# Patient Record
Sex: Female | Born: 1965 | Race: Black or African American | Hispanic: No | Marital: Single | Smoking: Current every day smoker
Health system: Southern US, Community
[De-identification: ages and names within clinical notes are randomized; demographics above are authoritative.]

## PROBLEM LIST (undated history)

## (undated) DIAGNOSIS — B009 Herpesviral infection, unspecified: Secondary | ICD-10-CM

## (undated) DIAGNOSIS — D219 Benign neoplasm of connective and other soft tissue, unspecified: Secondary | ICD-10-CM

## (undated) DIAGNOSIS — D649 Anemia, unspecified: Secondary | ICD-10-CM

## (undated) HISTORY — DX: Herpesviral infection, unspecified: B00.9

## (undated) HISTORY — DX: Benign neoplasm of connective and other soft tissue, unspecified: D21.9

## (undated) HISTORY — PX: TUBAL LIGATION: SHX77

## (undated) HISTORY — PX: UTERINE ARTERY EMBOLIZATION: SHX2629

## (undated) HISTORY — DX: Anemia, unspecified: D64.9

---

## 2008-10-12 ENCOUNTER — Emergency Department (HOSPITAL_COMMUNITY): Admission: EM | Admit: 2008-10-12 | Discharge: 2008-10-12 | Payer: Self-pay | Admitting: Family Medicine

## 2009-02-23 ENCOUNTER — Encounter: Admission: RE | Admit: 2009-02-23 | Discharge: 2009-02-23 | Payer: Self-pay | Admitting: Family Medicine

## 2009-03-05 ENCOUNTER — Encounter: Admission: RE | Admit: 2009-03-05 | Discharge: 2009-03-05 | Payer: Self-pay | Admitting: Family Medicine

## 2010-08-16 ENCOUNTER — Ambulatory Visit
Admission: RE | Admit: 2010-08-16 | Discharge: 2010-08-16 | Payer: Self-pay | Source: Home / Self Care | Attending: Gynecology | Admitting: Gynecology

## 2010-09-01 ENCOUNTER — Encounter: Payer: Self-pay | Admitting: Family Medicine

## 2010-09-02 ENCOUNTER — Encounter: Payer: Self-pay | Admitting: Family Medicine

## 2011-09-15 ENCOUNTER — Ambulatory Visit (INDEPENDENT_AMBULATORY_CARE_PROVIDER_SITE_OTHER): Payer: Managed Care, Other (non HMO) | Admitting: Family Medicine

## 2011-09-15 ENCOUNTER — Ambulatory Visit: Payer: Managed Care, Other (non HMO) | Admitting: Radiology

## 2011-09-15 ENCOUNTER — Ambulatory Visit: Payer: Managed Care, Other (non HMO)

## 2011-09-15 DIAGNOSIS — N3943 Post-void dribbling: Secondary | ICD-10-CM

## 2011-09-15 DIAGNOSIS — R1084 Generalized abdominal pain: Secondary | ICD-10-CM

## 2011-09-15 DIAGNOSIS — Z113 Encounter for screening for infections with a predominantly sexual mode of transmission: Secondary | ICD-10-CM

## 2011-09-15 DIAGNOSIS — D649 Anemia, unspecified: Secondary | ICD-10-CM

## 2011-09-15 DIAGNOSIS — D619 Aplastic anemia, unspecified: Secondary | ICD-10-CM

## 2011-09-15 DIAGNOSIS — R39198 Other difficulties with micturition: Secondary | ICD-10-CM

## 2011-09-15 DIAGNOSIS — R102 Pelvic and perineal pain: Secondary | ICD-10-CM

## 2011-09-15 LAB — POCT URINALYSIS DIPSTICK
Bilirubin, UA: NEGATIVE
Glucose, UA: NEGATIVE
Ketones, UA: NEGATIVE
Leukocytes, UA: NEGATIVE
Nitrite, UA: NEGATIVE
Protein, UA: NEGATIVE
Spec Grav, UA: 1.02
Urobilinogen, UA: 0.2
pH, UA: 5.5

## 2011-09-15 LAB — POCT CBC
Granulocyte percent: 89 %G — AB (ref 37–80)
HCT, POC: 36.1 % — AB (ref 37.7–47.9)
Hemoglobin: 11.5 g/dL — AB (ref 12.2–16.2)
Lymph, poc: 1 (ref 0.6–3.4)
MCH, POC: 29.8 pg (ref 27–31.2)
MCHC: 31.9 g/dL (ref 31.8–35.4)
MCV: 93.6 fL (ref 80–97)
MID (cbc): 0.5 (ref 0–0.9)
MPV: 8 fL (ref 0–99.8)
POC Granulocyte: 11.9 — AB (ref 2–6.9)
POC LYMPH PERCENT: 7.3 %L — AB (ref 10–50)
POC MID %: 3.7 % (ref 0–12)
Platelet Count, POC: 325 10*3/uL (ref 142–424)
RBC: 3.86 M/uL — AB (ref 4.04–5.48)
RDW, POC: 13.8 %
WBC: 13.4 10*3/uL — AB (ref 4.6–10.2)

## 2011-09-15 LAB — POCT WET PREP WITH KOH
Clue Cells Wet Prep HPF POC: NEGATIVE
KOH Prep POC: NEGATIVE
Trichomonas, UA: NEGATIVE
Yeast Wet Prep HPF POC: NEGATIVE

## 2011-09-15 LAB — POCT UA - MICROSCOPIC ONLY
Casts, Ur, LPF, POC: NEGATIVE
Crystals, Ur, HPF, POC: NEGATIVE
Mucus, UA: NEGATIVE
Yeast, UA: NEGATIVE

## 2011-09-15 MED ORDER — CEFTRIAXONE SODIUM 1 G IJ SOLR: 1.0000 g | Freq: Once | INTRAMUSCULAR | Status: DC

## 2011-09-15 MED ORDER — CIPROFLOXACIN HCL 500 MG PO TABS: 500.0000 mg | ORAL_TABLET | Freq: Two times a day (BID) | ORAL | Status: AC

## 2011-09-15 MED ORDER — CEFTRIAXONE SODIUM 1 G IJ SOLR
1.0000 g | INTRAMUSCULAR | Status: AC
  Administered 2011-09-15: 1 g via INTRAMUSCULAR

## 2011-09-15 NOTE — Progress Notes (Signed)
[facility]:  Office Visit  Chief Complaint:  Chief Complaint  Patient presents with  . Abdominal Pain    x 1 day radiating to back   . Nausea    x 1 day    HPI: Kendra Alvarado is a 46 y.o. female who complains of 1 day history of low abd pain and pelvic pain bilaterally radiating to back. She is currently on the end of her period. Has a h/o of fibroids s/p uterine artery embolization. She has had chills, and nausea. Temp has not been greater than 98.0. However states she has had subjective fevers. Able to pass gas, able to eat, had 2 normal BMs yesterday,  this morning. Still has gallbladder and appendix. No abd surgeries. No recent travels, no new foods, no diarrhea/constipation. Does not feel like her normal menstrual cramps. + h/o anemia has been off of iron supplements. Sexually active without using condoms. "pain is close to child birth".  Past Medical History  Diagnosis Date  . Anemia   . Fibroids     uterine  . HSV-2 infection   . HSV-2 (herpes simplex virus 2) infection    Past Surgical History  Procedure Date  . Uterine artery embolization   . Tubal ligation    History   Social History  . Marital Status: Single    Spouse Name: N/A    Number of Children: N/A  . Years of Education: N/A   Social History Main Topics  . Smoking status: Current Everyday Smoker -- 0.5 packs/day    Types: Cigarettes  . Smokeless tobacco: None  . Alcohol Use: No  . Drug Use: Yes    Special: Marijuana  . Sexually Active: Yes    Birth Control/ Protection: Coitus interruptus   Other Topics Concern  . None   Social History Narrative  . None   Family History  Problem Relation Age of Onset  . Asthma Mother   . Hypertension Mother   . Heart attack Father    No Known Allergies Prior to Admission medications   Medication Sig Start Date End Date Taking? Authorizing Provider  aspirin 325 MG EC tablet Take 325 mg by mouth daily.   Yes Historical Provider, MD      ROS: The patient denies night sweats, unintentional weight loss, chest pain, palpitations, wheezing, dyspnea on exertion, vomiting, abdominal pain, dysuria, hematuria, melena, numbness, weakness, or tingling. + decrease urinary stream, abd pain, flank pain, sub fevers and chills. + nausea  All other systems have been reviewed and were otherwise negative with the exception of those mentioned in the HPI and as above.    PHYSICAL EXAM: Filed Vitals:   09/15/11 1124  BP: 104/67  Pulse: 83  Temp: 98.4 F (36.9 C)  Resp: 16   Filed Vitals:   09/15/11 1124  Height: 5' 5.75" (1.67 m)  Weight: 150 lb (68.04 kg)   Body mass index is 24.40 kg/(m^2).  General: Alert, illl appearing HEENT:  Normocephalic, atraumatic, oropharynx patent. Cardiovascular:  Regular rate and rhythm, no rubs murmurs or gallops.  No Carotid bruits, radial pulse intact. No pedal edema.  Respiratory: Clear to auscultation bilaterally.  No wheezes, rales, or rhonchi.  No cyanosis, no use of accessory musculature GI: No organomegaly, abdomen is soft,  Decrease bowel sounds.  + tenderness lower abd. And pelvis, + guarding.  No masses. Skin: No rashes. Neurologic: Facial musculature symmetric. Psychiatric: Patient is appropriate throughout our interaction. Lymphatic: No axillary or cervical lymphadenopathy  Musculoskeletal: Gait intact. Genitourinary: Tenderness pelvis, no CMT, no lesions, no purulent dc, + menses  LABS: Results for orders placed in visit on 09/15/11  POCT UA - MICROSCOPIC ONLY      Component Value Range   WBC, Ur, HPF, POC 2-4     RBC, urine, microscopic 3-6     Bacteria, U Microscopic small     Mucus, UA negative     Epithelial cells, urine per micros 2-5     Crystals, Ur, HPF, POC negative     Casts, Ur, LPF, POC negative     Yeast, UA negative    POCT URINALYSIS DIPSTICK      Component Value Range   Color, UA yellow     Clarity, UA hazy     Glucose, UA negative     Bilirubin, UA  negative     Ketones, UA negative     Spec Grav, UA 1.020     Blood, UA moderate     pH, UA 5.5     Protein, UA negative     Urobilinogen, UA 0.2     Nitrite, UA negative     Leukocytes, UA Negative    POCT CBC      Component Value Range   WBC 13.4 (*) 4.6 - 10.2 (K/uL)   Lymph, poc 1.0  0.6 - 3.4    POC LYMPH PERCENT 7.3 (*) 10 - 50 (%L)   MID (cbc) 0.5  0 - 0.9    POC MID % 3.7  0 - 12 (%M)   POC Granulocyte 11.9 (*) 2 - 6.9    Granulocyte percent 89.0 (*) 37 - 80 (%G)   RBC 3.86 (*) 4.04 - 5.48 (M/uL)   Hemoglobin 11.5 (*) 12.2 - 16.2 (g/dL)   HCT, POC 16.1 (*) 09.6 - 47.9 (%)   MCV 93.6  80 - 97 (fL)   MCH, POC 29.8  27 - 31.2 (pg)   MCHC 31.9  31.8 - 35.4 (g/dL)   RDW, POC 04.5     Platelet Count, POC 325  142 - 424 (K/uL)   MPV 8.0  0 - 99.8 (fL)  POCT WET PREP WITH KOH      Component Value Range   Trichomonas, UA Negative     Clue Cells Wet Prep HPF POC negative     Epithelial Wet Prep HPF POC 1-4     Yeast Wet Prep HPF POC negative     Bacteria Wet Prep HPF POC trace     RBC Wet Prep HPF POC 1-4     WBC Wet Prep HPF POC 0-1     KOH Prep POC Negative       EKG/XRAY:   Primary read interpreted by Dr. Conley Rolls at [facility]: abd xray + feces, no obstruction.   ASSESSMENT/PLAN: Encounter Diagnoses  Name Primary?  . Abdominal pain   . Urinary dribbling   . Anemia   . Screening for STD (sexually transmitted disease)   . Pelvic pain Yes    1. Pevic and lower abd pain-? Appendicitis vs diverticular dz vs ovarian cyst less likely kidney stones or obstruction vs STD. Pt has a white count of 13.4. Will get stat CT scan and give Cipro 500 mg BID x 10 days. Labs pending HIV, RPR, G/C gential probe.    Rockne Coons, DO 09/15/2011 3:06 PM   Patient's phone number today is [phone]

## 2011-09-16 ENCOUNTER — Telehealth: Payer: Self-pay | Admitting: Family Medicine

## 2011-09-16 ENCOUNTER — Ambulatory Visit
Admission: RE | Admit: 2011-09-16 | Discharge: 2011-09-16 | Disposition: A | Discharge status: Home / Self Care | Payer: Self-pay | Source: Ambulatory Visit | Attending: Family Medicine | Admitting: Family Medicine

## 2011-09-16 DIAGNOSIS — N3943 Post-void dribbling: Secondary | ICD-10-CM

## 2011-09-16 DIAGNOSIS — Z113 Encounter for screening for infections with a predominantly sexual mode of transmission: Secondary | ICD-10-CM

## 2011-09-16 DIAGNOSIS — D649 Anemia, unspecified: Secondary | ICD-10-CM

## 2011-09-16 LAB — GC/CHLAMYDIA PROBE AMP, GENITAL
Chlamydia, DNA Probe: NEGATIVE
GC Probe Amp, Genital: NEGATIVE

## 2011-09-16 LAB — RPR

## 2011-09-16 LAB — HIV ANTIBODY (ROUTINE TESTING W REFLEX): HIV: NONREACTIVE

## 2011-09-16 MED ORDER — IOHEXOL 300 MG/ML  SOLN
100.0000 mL | Freq: Once | INTRAMUSCULAR | Status: AC | PRN
  Administered 2011-09-16: 100 mL via INTRAVENOUS

## 2011-09-16 MED ORDER — IOHEXOL 300 MG/ML  SOLN
40.0000 mL | Freq: Once | INTRAMUSCULAR | Status: AC | PRN
  Administered 2011-09-16: 40 mL via ORAL

## 2011-09-16 NOTE — Telephone Encounter (Signed)
Spoke with patient today, she is feeling better. Discussed with her CT results and would like her to finish her Cipro and f/u next week so that we can assess if she needs f/u with GI doctor and any additional imaging ie MRI of abdomen with focus on pancreas based on abnormal CT results. She states she will come on Monday.

## 2011-09-30 ENCOUNTER — Encounter: Payer: Self-pay | Admitting: Family Medicine

## 2011-10-23 ENCOUNTER — Encounter: Payer: Self-pay | Admitting: Family Medicine

## 2012-03-15 NOTE — Progress Notes (Signed)
This encounter was created in error - please disregard.

## 2012-07-26 ENCOUNTER — Emergency Department (HOSPITAL_COMMUNITY): Payer: PRIVATE HEALTH INSURANCE

## 2012-07-26 ENCOUNTER — Emergency Department (HOSPITAL_COMMUNITY)
Admission: EM | Admit: 2012-07-26 | Discharge: 2012-07-26 | Disposition: A | Discharge status: Home / Self Care | Payer: PRIVATE HEALTH INSURANCE | Attending: Emergency Medicine | Admitting: Emergency Medicine

## 2012-07-26 ENCOUNTER — Encounter (HOSPITAL_COMMUNITY): Payer: Self-pay | Admitting: Emergency Medicine

## 2012-07-26 DIAGNOSIS — D259 Leiomyoma of uterus, unspecified: Secondary | ICD-10-CM

## 2012-07-26 DIAGNOSIS — Z791 Long term (current) use of non-steroidal anti-inflammatories (NSAID): Secondary | ICD-10-CM | POA: Insufficient documentation

## 2012-07-26 DIAGNOSIS — Z9851 Tubal ligation status: Secondary | ICD-10-CM | POA: Insufficient documentation

## 2012-07-26 DIAGNOSIS — F17200 Nicotine dependence, unspecified, uncomplicated: Secondary | ICD-10-CM | POA: Insufficient documentation

## 2012-07-26 DIAGNOSIS — R109 Unspecified abdominal pain: Secondary | ICD-10-CM | POA: Insufficient documentation

## 2012-07-26 LAB — COMPREHENSIVE METABOLIC PANEL
BUN: 7 mg/dL (ref 6–23)
CO2: 26 mEq/L (ref 19–32)
Calcium: 8.9 mg/dL (ref 8.4–10.5)
Creatinine, Ser: 0.65 mg/dL (ref 0.50–1.10)
GFR calc Af Amer: >90 mL/min (ref >90)
GFR calc non Af Amer: >90 mL/min (ref >90)
Glucose, Bld: 105 mg/dL — ABNORMAL HIGH (ref 70–99)
Total Protein: 6.7 g/dL (ref 6.0–8.3)

## 2012-07-26 LAB — URINALYSIS, ROUTINE W REFLEX MICROSCOPIC
Bilirubin Urine: NEGATIVE
Leukocytes, UA: NEGATIVE
Nitrite: NEGATIVE
Specific Gravity, Urine: 1.033 — ABNORMAL HIGH (ref 1.005–1.030)
Urobilinogen, UA: 1 mg/dL (ref 0.0–1.0)

## 2012-07-26 LAB — CBC WITH DIFFERENTIAL/PLATELET
Eosinophils Absolute: 0.1 10*3/uL (ref 0.0–0.7)
Eosinophils Relative: 1 % (ref 0–5)
HCT: 36.4 % (ref 36.0–46.0)
Lymphocytes Relative: 35 % (ref 12–46)
Lymphs Abs: 2.4 10*3/uL (ref 0.7–4.0)
MCH: 31.9 pg (ref 26.0–34.0)
MCV: 94.3 fL (ref 78.0–100.0)
Monocytes Absolute: 0.5 10*3/uL (ref 0.1–1.0)
RBC: 3.86 MIL/uL — ABNORMAL LOW (ref 3.87–5.11)
RDW: 12.6 % (ref 11.5–15.5)
WBC: 7.1 10*3/uL (ref 4.0–10.5)

## 2012-07-26 LAB — URINE MICROSCOPIC-ADD ON

## 2012-07-26 MED ORDER — KETOROLAC TROMETHAMINE 30 MG/ML IJ SOLN
30.0000 mg | Freq: Once | INTRAMUSCULAR | Status: AC
  Administered 2012-07-26: 30 mg via INTRAVENOUS
  Filled 2012-07-26: qty 1

## 2012-07-26 MED ORDER — HYDROMORPHONE HCL PF 1 MG/ML IJ SOLN
1.0000 mg | Freq: Once | INTRAMUSCULAR | Status: AC
  Administered 2012-07-26: 1 mg via INTRAVENOUS
  Filled 2012-07-26: qty 1

## 2012-07-26 MED ORDER — METOCLOPRAMIDE HCL 10 MG PO TABS: 10.0000 mg | ORAL_TABLET | Freq: Three times a day (TID) | ORAL | Status: DC | PRN

## 2012-07-26 MED ORDER — ONDANSETRON HCL 4 MG/2ML IJ SOLN
INTRAMUSCULAR | Status: AC
  Filled 2012-07-26: qty 2

## 2012-07-26 NOTE — ED Provider Notes (Signed)
History     CSN: [id number]  Arrival date & time 07/26/12  4098   First MD Initiated Contact with Patient 07/26/12 0325      Chief Complaint  Patient presents with  . Flank Pain    (Consider location/radiation/quality/duration/timing/severity/associated sxs/prior treatment) HPI Comments: 46 year old female with a history of no significant past medical history who presents with 2 days of intermittent left-sided sharp and stabbing pain. Nothing seems to make it better or worse, there is no comfortable position, she does not have any nausea or diaphoresis and denies any hematuria. She has taken Aleve with mild improvement. At this time her pain is moderate.  Patient is a 46 y.o. female presenting with flank pain. The history is provided by the patient.  Flank Pain    Past Medical History  Diagnosis Date  . Anemia   . Fibroids     uterine  . HSV-2 infection   . HSV-2 (herpes simplex virus 2) infection     Past Surgical History  Procedure Date  . Uterine artery embolization   . Tubal ligation     Family History  Problem Relation Age of Onset  . Asthma Mother   . Hypertension Mother   . Heart attack Father     History  Substance Use Topics  . Smoking status: Current Every Day Smoker -- 0.5 packs/day    Types: Cigarettes  . Smokeless tobacco: Not on file  . Alcohol Use: No    OB History    Grav Para Term Preterm Abortions TAB SAB Ect Mult Living                  Review of Systems  Genitourinary: Positive for flank pain.  All other systems reviewed and are negative.    Allergies  Review of patient's allergies indicates no known allergies.  Home Medications   Current Outpatient Rx  Name  Route  Sig  Dispense  Refill  . BC HEADACHE POWDER PO   Oral   Take 1 packet by mouth daily as needed. For pain         . FERROUS SULFATE CR PO   Oral   Take by mouth.         . ADULT MULTIVITAMIN W/MINERALS CH   Oral   Take 1 tablet by mouth daily.          Marland Kitchen NAPROXEN SODIUM 220 MG PO TABS   Oral   Take 440 mg by mouth 2 (two) times daily with a meal.         . OVER THE COUNTER MEDICATION   Oral   Take 1 tablet by mouth daily. Stool softener         . METOCLOPRAMIDE HCL 10 MG PO TABS   Oral   Take 1 tablet (10 mg total) by mouth 3 (three) times daily as needed (headache / nausea).   20 tablet   0     BP 122/83  Pulse 58  Temp 97.7 F (36.5 C) (Oral)  Resp 18  SpO2 98%  LMP 07/19/2012  Physical Exam  Nursing note and vitals reviewed. Constitutional: She appears well-developed and well-nourished. No distress.  HENT:  Head: Normocephalic and atraumatic.  Mouth/Throat: Oropharynx is clear and moist. No oropharyngeal exudate.  Eyes: Conjunctivae normal and EOM are normal. Pupils are equal, round, and reactive to light. Right eye exhibits no discharge. Left eye exhibits no discharge. No scleral icterus.  Neck: Normal range of motion. Neck supple. No JVD  present. No thyromegaly present.  Cardiovascular: Normal rate, regular rhythm, normal heart sounds and intact distal pulses.  Exam reveals no gallop and no friction rub.   No murmur heard. Pulmonary/Chest: Effort normal and breath sounds normal. No respiratory distress. She has no wheezes. She has no rales.  Abdominal: Soft. Bowel sounds are normal. She exhibits no distension and no mass. There is no tenderness.       The abdomen is soft and nontender, no CVA tenderness  Musculoskeletal: Normal range of motion. She exhibits no edema and no tenderness.  Lymphadenopathy:    She has no cervical adenopathy.  Neurological: She is alert. Coordination normal.  Skin: Skin is warm and dry. No rash noted. No erythema.  Psychiatric: She has a normal mood and affect. Her behavior is normal.    ED Course  Procedures (including critical care time)  Labs Reviewed  CBC WITH DIFFERENTIAL - Abnormal; Notable for the following:    RBC 3.86 (*)     All other components within normal  limits  COMPREHENSIVE METABOLIC PANEL - Abnormal; Notable for the following:    Potassium 3.4 (*)     Glucose, Bld 105 (*)     Total Bilirubin 0.2 (*)     All other components within normal limits  URINALYSIS, ROUTINE W REFLEX MICROSCOPIC - Abnormal; Notable for the following:    Specific Gravity, Urine 1.033 (*)     Hgb urine dipstick SMALL (*)     All other components within normal limits  URINE MICROSCOPIC-ADD ON - Abnormal; Notable for the following:    Squamous Epithelial / LPF FEW (*)     Bacteria, UA FEW (*)     All other components within normal limits   Ct Abdomen Pelvis Wo Contrast  07/26/2012  *RADIOLOGY REPORT*  Clinical Data: Left flank pain  CT ABDOMEN AND PELVIS WITHOUT CONTRAST  Technique:  Multidetector CT imaging of the abdomen and pelvis was performed following the standard protocol without intravenous contrast.  Comparison: 09/16/2011  Findings: Lung bases are predominately clear.  Heart size within normal limits.  No pleural or pericardial effusion.  Organ abnormality/lesion detection is limited in the absence of intravenous contrast. Within this limitation, unremarkable liver, biliary system, spleen, adrenal glands.  Unchanged cystic focus along the anterior body/neck of the pancreas.  Symmetric renal size.  No hydronephrosis or hydroureter.  No bowel obstruction.  There is mild stranding of the right mesorectal fat.  Normal appendix.  Nonspecific small bowel air- fluid levels without overt dilatation. There is fecalization of a left lower quadrant loop suggesting delayed transit.  No free intraperitoneal air or fluid.  No lymphadenopathy.  There is scattered atherosclerotic calcification of the aorta and its branches. No aneurysmal dilatation.  Small fat containing umbilical hernia.  Fibroid uterus.  Decompressed bladder.  Adnexa inseparable from the enlarged uterus.  No acute osseous finding.  IMPRESSION: Nonspecific appearance to small bowel.  There are a few air-fluid  levels and evidence of delayed transit, without evidence of obstruction or active inflammation at this time.  Evaluation is somewhat limited in the absence of intravenous or enteric contrast.  Mild right mesorectal fat stranding may reflect acute or sequelae of prior rectal or uterine inflammation.  Fibroid uterus.  Unchanged cystic focus along the anterior body/neck of the pancreas measuring 8 mm.   Original Report Authenticated By: Jearld Lesch, M.D.      1. Flank pain   2. Fibroid uterus       MDM  The patient appears to be experiencing colicky pain related to the left side of her abdomen however she has no tenderness this would be consistent with a kidney stone. Vital signs are normal, will send for urinalysis and CT abdomen pelvis.   The patient has been reevaluated, she has no pain, no tenderness on exam and I have reviewed her CT scan findings with her. There is no evidence of overt bowel obstruction or kidney stones. Labs are unremarkable, the patient is stable for discharge, she will be given Reglan and a list of followup community physicians.     Vida Roller, MD 07/26/12 [phone]

## 2012-07-26 NOTE — ED Notes (Signed)
Patient states she is having two days of right flank pain, no problems urinating.  She describes are sharp pains.  Patient states she took a [facility] Powder with no relief.  She did take some aleve with some relief.  No nausea or vomiting.

## 2012-07-26 NOTE — ED Notes (Signed)
Denies N/V/D at this time 

## 2012-07-26 NOTE — ED Notes (Signed)
Patient transported to CT 

## 2015-01-17 ENCOUNTER — Emergency Department (HOSPITAL_COMMUNITY): Payer: PRIVATE HEALTH INSURANCE

## 2015-01-17 ENCOUNTER — Emergency Department (HOSPITAL_COMMUNITY)
Admission: EM | Admit: 2015-01-17 | Discharge: 2015-01-17 | Disposition: A | Discharge status: Home / Self Care | Payer: PRIVATE HEALTH INSURANCE | Attending: Emergency Medicine | Admitting: Emergency Medicine

## 2015-01-17 ENCOUNTER — Encounter (HOSPITAL_COMMUNITY): Payer: Self-pay | Admitting: *Deleted

## 2015-01-17 DIAGNOSIS — Z8619 Personal history of other infectious and parasitic diseases: Secondary | ICD-10-CM | POA: Insufficient documentation

## 2015-01-17 DIAGNOSIS — Z86018 Personal history of other benign neoplasm: Secondary | ICD-10-CM | POA: Insufficient documentation

## 2015-01-17 DIAGNOSIS — M79605 Pain in left leg: Secondary | ICD-10-CM | POA: Insufficient documentation

## 2015-01-17 DIAGNOSIS — R109 Unspecified abdominal pain: Secondary | ICD-10-CM | POA: Insufficient documentation

## 2015-01-17 DIAGNOSIS — Z3202 Encounter for pregnancy test, result negative: Secondary | ICD-10-CM | POA: Insufficient documentation

## 2015-01-17 DIAGNOSIS — Z791 Long term (current) use of non-steroidal anti-inflammatories (NSAID): Secondary | ICD-10-CM | POA: Insufficient documentation

## 2015-01-17 DIAGNOSIS — D649 Anemia, unspecified: Secondary | ICD-10-CM | POA: Insufficient documentation

## 2015-01-17 DIAGNOSIS — M5442 Lumbago with sciatica, left side: Secondary | ICD-10-CM | POA: Insufficient documentation

## 2015-01-17 DIAGNOSIS — Z79899 Other long term (current) drug therapy: Secondary | ICD-10-CM | POA: Insufficient documentation

## 2015-01-17 DIAGNOSIS — R52 Pain, unspecified: Secondary | ICD-10-CM

## 2015-01-17 DIAGNOSIS — Z720 Tobacco use: Secondary | ICD-10-CM | POA: Insufficient documentation

## 2015-01-17 LAB — COMPREHENSIVE METABOLIC PANEL
ALBUMIN: 3.8 g/dL (ref 3.5–5.0)
ALK PHOS: 50 U/L (ref 38–126)
ALT: 13 U/L — ABNORMAL LOW (ref 14–54)
ANION GAP: 6 (ref 5–15)
AST: 17 U/L (ref 15–41)
BILIRUBIN TOTAL: 0.5 mg/dL (ref 0.3–1.2)
BUN: 7 mg/dL (ref 6–20)
CO2: 22 mmol/L (ref 22–32)
Calcium: 8.9 mg/dL (ref 8.9–10.3)
Chloride: 110 mmol/L (ref 101–111)
Creatinine, Ser: 0.61 mg/dL (ref 0.44–1.00)
GFR calc Af Amer: >60 mL/min (ref >60)
GFR calc non Af Amer: >60 mL/min (ref >60)
GLUCOSE: 92 mg/dL (ref 65–99)
POTASSIUM: 3.9 mmol/L (ref 3.5–5.1)
Sodium: 138 mmol/L (ref 135–145)
TOTAL PROTEIN: 6.6 g/dL (ref 6.5–8.1)

## 2015-01-17 LAB — CBC WITH DIFFERENTIAL/PLATELET
BASOS PCT: 0 % (ref 0–1)
Basophils Absolute: 0 10*3/uL (ref 0.0–0.1)
Eosinophils Absolute: 0.1 10*3/uL (ref 0.0–0.7)
Eosinophils Relative: 1 % (ref 0–5)
HCT: 41 % (ref 36.0–46.0)
Hemoglobin: 13.8 g/dL (ref 12.0–15.0)
Lymphocytes Relative: 37 % (ref 12–46)
Lymphs Abs: 1.9 10*3/uL (ref 0.7–4.0)
MCH: 31.4 pg (ref 26.0–34.0)
MCHC: 33.7 g/dL (ref 30.0–36.0)
MCV: 93.4 fL (ref 78.0–100.0)
MONO ABS: 0.4 10*3/uL (ref 0.1–1.0)
Monocytes Relative: 7 % (ref 3–12)
NEUTROS PCT: 55 % (ref 43–77)
Neutro Abs: 2.9 10*3/uL (ref 1.7–7.7)
Platelets: 280 10*3/uL (ref 150–400)
RBC: 4.39 MIL/uL (ref 3.87–5.11)
RDW: 13.3 % (ref 11.5–15.5)
WBC: 5.2 10*3/uL (ref 4.0–10.5)

## 2015-01-17 LAB — URINALYSIS, ROUTINE W REFLEX MICROSCOPIC
BILIRUBIN URINE: NEGATIVE
Glucose, UA: NEGATIVE mg/dL
Ketones, ur: NEGATIVE mg/dL
LEUKOCYTES UA: NEGATIVE
Nitrite: NEGATIVE
Protein, ur: NEGATIVE mg/dL
SPECIFIC GRAVITY, URINE: 1.025 (ref 1.005–1.030)
UROBILINOGEN UA: 1 mg/dL (ref 0.0–1.0)
pH: 6 (ref 5.0–8.0)

## 2015-01-17 LAB — URINE MICROSCOPIC-ADD ON

## 2015-01-17 LAB — LIPASE, BLOOD: LIPASE: 27 U/L (ref 22–51)

## 2015-01-17 LAB — PREGNANCY, URINE: PREG TEST UR: NEGATIVE

## 2015-01-17 MED ORDER — DICLOFENAC SODIUM 75 MG PO TBEC: 75.0000 mg | DELAYED_RELEASE_TABLET | Freq: Two times a day (BID) | ORAL | Status: DC

## 2015-01-17 MED ORDER — METHOCARBAMOL 500 MG PO TABS: 500.0000 mg | ORAL_TABLET | Freq: Two times a day (BID) | ORAL | Status: DC

## 2015-01-17 NOTE — Discharge Instructions (Signed)
Back Pain, Adult °Low back pain is very common. About 1 in 5 people have back pain. The cause of low back pain is rarely dangerous. The pain often gets better over time. About half of people with a sudden onset of back pain feel better in just 2 weeks. About 8 in 10 people feel better by 6 weeks.  °CAUSES °Some common causes of back pain include: °· Strain of the muscles or ligaments supporting the spine. °· Wear and tear (degeneration) of the spinal discs. °· Arthritis. °· Direct injury to the back. °DIAGNOSIS °Most of the time, the direct cause of low back pain is not known. However, back pain can be treated effectively even when the exact cause of the pain is unknown. Answering your caregiver's questions about your overall health and symptoms is one of the most accurate ways to make sure the cause of your pain is not dangerous. If your caregiver needs more information, he or she may order lab work or imaging tests (X-rays or MRIs). However, even if imaging tests show changes in your back, this usually does not require surgery. °HOME CARE INSTRUCTIONS °For many people, back pain returns. Since low back pain is rarely dangerous, it is often a condition that people can learn to manage on their own.  °· Remain active. It is stressful on the back to sit or stand in one place. Do not sit, drive, or stand in one place for more than 30 minutes at a time. Take short walks on level surfaces as soon as pain allows. Try to increase the length of time you walk each day. °· Do not stay in bed. Resting more than 1 or 2 days can delay your recovery. °· Do not avoid exercise or work. Your body is made to move. It is not dangerous to be active, even though your back may hurt. Your back will likely heal faster if you return to being active before your pain is gone. °· Pay attention to your body when you  bend and lift. Many people have less discomfort when lifting if they bend their knees, keep the load close to their bodies, and  avoid twisting. Often, the most comfortable positions are those that put less stress on your recovering back. °· Find a comfortable position to sleep. Use a firm mattress and lie on your side with your knees slightly bent. If you lie on your back, put a pillow under your knees. °· Only take over-the-counter or prescription medicines as directed by your caregiver. Over-the-counter medicines to reduce pain and inflammation are often the most helpful. Your caregiver may prescribe muscle relaxant drugs. These medicines help dull your pain so you can more quickly return to your normal activities and healthy exercise. °· Put ice on the injured area. °¨ Put ice in a plastic bag. °¨ Place a towel between your skin and the bag. °¨ Leave the ice on for 15-20 minutes, 03-04 times a day for the first 2 to 3 days. After that, ice and heat may be alternated to reduce pain and spasms. °· Ask your caregiver about trying back exercises and gentle massage. This may be of some benefit. °· Avoid feeling anxious or stressed. Stress increases muscle tension and can worsen back pain. It is important to recognize when you are anxious or stressed and learn ways to manage it. Exercise is a great option. °SEEK MEDICAL CARE IF: °· You have pain that is not relieved with rest or medicine. °· You have pain that does not improve in 1 week. °· You have new symptoms. °· You are generally not feeling well. °SEEK   IMMEDIATE MEDICAL CARE IF:   You have pain that radiates from your back into your legs.  You develop new bowel or bladder control problems.  You have unusual weakness or numbness in your arms or legs.  You develop nausea or vomiting.  You develop abdominal pain.  You feel faint. Document Released: 07/28/2005 Document Revised: 01/27/2012 Document Reviewed: 11/29/2013 [facility] Patient Information 2015 [place], [place]. This information is not intended to replace advice given to you by your health care provider. Make sure you  discuss any questions you have with your health care provider. Sciatica Sciatica is pain, weakness, numbness, or tingling along the path of the sciatic nerve. The nerve starts in the lower back and runs down the back of each leg. The nerve controls the muscles in the lower leg and in the back of the knee, while also providing sensation to the back of the thigh, lower leg, and the sole of your foot. Sciatica is a symptom of another medical condition. For instance, nerve damage or certain conditions, such as a herniated disk or bone spur on the spine, pinch or put pressure on the sciatic nerve. This causes the pain, weakness, or other sensations normally associated with sciatica. Generally, sciatica only affects one side of the body. CAUSES   Herniated or slipped disc.  Degenerative disk disease.  A pain disorder involving the narrow muscle in the buttocks (piriformis syndrome).  Pelvic injury or fracture.  Pregnancy.  Tumor (rare). SYMPTOMS  Symptoms can vary from mild to very severe. The symptoms usually travel from the low back to the buttocks and down the back of the leg. Symptoms can include:  Mild tingling or dull aches in the lower back, leg, or hip.  Numbness in the back of the calf or sole of the foot.  Burning sensations in the lower back, leg, or hip.  Sharp pains in the lower back, leg, or hip.  Leg weakness.  Severe back pain inhibiting movement. These symptoms may get worse with coughing, sneezing, laughing, or prolonged sitting or standing. Also, being overweight may worsen symptoms. DIAGNOSIS  Your caregiver will perform a physical exam to look for common symptoms of sciatica. He or she may ask you to do certain movements or activities that would trigger sciatic nerve pain. Other tests may be performed to find the cause of the sciatica. These may include:  Blood tests.  X-rays.  Imaging tests, such as an MRI or CT scan. TREATMENT  Treatment is directed at the  cause of the sciatic pain. Sometimes, treatment is not necessary and the pain and discomfort goes away on its own. If treatment is needed, your caregiver may suggest:  Over-the-counter medicines to relieve pain.  Prescription medicines, such as anti-inflammatory medicine, muscle relaxants, or narcotics.  Applying heat or ice to the painful area.  Steroid injections to lessen pain, irritation, and inflammation around the nerve.  Reducing activity during periods of pain.  Exercising and stretching to strengthen your abdomen and improve flexibility of your spine. Your caregiver may suggest losing weight if the extra weight makes the back pain worse.  Physical therapy.  Surgery to eliminate what is pressing or pinching the nerve, such as a bone spur or part of a herniated disk. HOME CARE INSTRUCTIONS   Only take over-the-counter or prescription medicines for pain or discomfort as directed by your caregiver.  Apply ice to the affected area for 20 minutes, 3-4 times a day for the first 48-72 hours. Then try heat in the  same way.  Exercise, stretch, or perform your usual activities if these do not aggravate your pain.  Attend physical therapy sessions as directed by your caregiver.  Keep all follow-up appointments as directed by your caregiver.  Do not wear high heels or shoes that do not provide proper support.  Check your mattress to see if it is too soft. A firm mattress may lessen your pain and discomfort. SEEK IMMEDIATE MEDICAL CARE IF:   You lose control of your bowel or bladder (incontinence).  You have increasing weakness in the lower back, pelvis, buttocks, or legs.  You have redness or swelling of your back.  You have a burning sensation when you urinate.  You have pain that gets worse when you lie down or awakens you at night.  Your pain is worse than you have experienced in the past.  Your pain is lasting longer than 4 weeks.  You are suddenly losing weight  without reason. MAKE SURE YOU:  Understand these instructions.  Will watch your condition.  Will get help right away if you are not doing well or get worse. Document Released: 07/22/2001 Document Revised: 01/27/2012 Document Reviewed: 12/07/2011 [facility] Patient Information 2015 [place], [place]. This information is not intended to replace advice given to you by your health care provider. Make sure you discuss any questions you have with your health care provider.

## 2015-01-17 NOTE — ED Notes (Signed)
Pt reports pain in left side of abdomen x 4 days. Hx of fibroid tumors. Pain also in left leg in thigh. No known injury.

## 2015-01-17 NOTE — ED Provider Notes (Signed)
CSN: [id number]     Arrival date & time 01/17/15  1149 History   None    Chief Complaint  Patient presents with  . Abdominal Pain  . Leg Pain     (Consider location/radiation/quality/duration/timing/severity/associated sxs/prior Treatment) Patient is a 49 y.o. female presenting with abdominal pain. The history is provided by the patient. No language interpreter was used.  Abdominal Pain Pain location:  L flank Pain quality: aching and stabbing   Pain radiates to:  L leg Pain severity:  Moderate Onset quality:  Gradual Duration:  4 days Timing:  Constant Progression:  Worsening Chronicity:  New Context: recent illness   Relieved by:  Nothing Worsened by:  Nothing tried Ineffective treatments:  None tried Associated symptoms: no chest pain and no dysuria   Risk factors: has not had multiple surgeries and not pregnant     Past Medical History  Diagnosis Date  . Anemia   . Fibroids     uterine  . HSV-2 infection   . HSV-2 (herpes simplex virus 2) infection    Past Surgical History  Procedure Laterality Date  . Uterine artery embolization    . Tubal ligation     Family History  Problem Relation Age of Onset  . Asthma Mother   . Hypertension Mother   . Heart attack Father    History  Substance Use Topics  . Smoking status: Current Every Day Smoker -- 0.50 packs/day    Types: Cigarettes  . Smokeless tobacco: Not on file  . Alcohol Use: No   OB History    No data available     Review of Systems  Cardiovascular: Negative for chest pain.  Gastrointestinal: Positive for abdominal pain.  Genitourinary: Negative for dysuria.  All other systems reviewed and are negative.     Allergies  Review of patient's allergies indicates no known allergies.  Home Medications   Prior to Admission medications   Medication Sig Start Date End Date Taking? Authorizing Provider  Ferrous Sulfate Dried (FERROUS SULFATE CR PO) Take 1-2 tablets by mouth daily.    Yes Historical  Provider, MD  Multiple Vitamin (MULTIVITAMIN WITH MINERALS) TABS Take 1 tablet by mouth daily.   Yes Historical Provider, MD  naproxen sodium (ALEVE) 220 MG tablet Take 440 mg by mouth 2 (two) times daily.   Yes Historical Provider, MD  OVER THE COUNTER MEDICATION Take 1 tablet by mouth daily. Stool softener   Yes Historical Provider, MD  Vitamin D, Cholecalciferol, 1000 UNITS TABS Take 1-2 tablets by mouth daily.   Yes Historical Provider, MD  metoCLOPramide (REGLAN) 10 MG tablet Take 1 tablet (10 mg total) by mouth 3 (three) times daily as needed (headache / nausea). Patient not taking: Reported on 01/17/2015 07/26/12   Noemi Chapel, MD   BP 128/75 mmHg  Pulse 70  Temp(Src) 97.9 F (36.6 C) (Oral)  Resp 18  Ht 5\' 4"  (1.626 m)  Wt 155 lb (70.308 kg)  BMI 26.59 kg/m2  SpO2 99%  LMP 01/03/2015 Physical Exam  Constitutional: She is oriented to person, place, and time. She appears well-developed and well-nourished.  HENT:  Head: Normocephalic and atraumatic.  Eyes: EOM are normal.  Neck: Normal range of motion.  Cardiovascular: Normal rate and normal heart sounds.   Pulmonary/Chest: Effort normal.  Abdominal: Soft. She exhibits no distension. There is tenderness.  Left flank and left side pain  Musculoskeletal: Normal range of motion.  Neurological: She is alert and oriented to person, place, and time.  Psychiatric: She has a normal mood and affect.  Nursing note and vitals reviewed.   ED Course  Procedures (including critical care time) Labs Review Labs Reviewed  URINALYSIS, ROUTINE W REFLEX MICROSCOPIC (NOT AT [facility]) - Abnormal; Notable for the following:    Hgb urine dipstick MODERATE (*)    All other components within normal limits  COMPREHENSIVE METABOLIC PANEL - Abnormal; Notable for the following:    ALT 13 (*)    All other components within normal limits  URINE MICROSCOPIC-ADD ON - Abnormal; Notable for the following:    Bacteria, UA FEW (*)    All other components  within normal limits  PREGNANCY, URINE  CBC WITH DIFFERENTIAL/PLATELET  LIPASE, BLOOD    Imaging Review No results found.   EKG Interpretation None     Results for orders placed or performed during the hospital encounter of 01/17/15  Pregnancy, urine  Result Value Ref Range   Preg Test, Ur NEGATIVE NEGATIVE  Urinalysis, Routine w reflex microscopic (not at [facility])  Result Value Ref Range   Color, Urine YELLOW YELLOW   APPearance CLEAR CLEAR   Specific Gravity, Urine 1.025 1.005 - 1.030   pH 6.0 5.0 - 8.0   Glucose, UA NEGATIVE NEGATIVE mg/dL   Hgb urine dipstick MODERATE (A) NEGATIVE   Bilirubin Urine NEGATIVE NEGATIVE   Ketones, ur NEGATIVE NEGATIVE mg/dL   Protein, ur NEGATIVE NEGATIVE mg/dL   Urobilinogen, UA 1.0 0.0 - 1.0 mg/dL   Nitrite NEGATIVE NEGATIVE   Leukocytes, UA NEGATIVE NEGATIVE  CBC WITH DIFFERENTIAL  Result Value Ref Range   WBC 5.2 4.0 - 10.5 K/uL   RBC 4.39 3.87 - 5.11 MIL/uL   Hemoglobin 13.8 12.0 - 15.0 g/dL   HCT 41.0 36.0 - 46.0 %   MCV 93.4 78.0 - 100.0 fL   MCH 31.4 26.0 - 34.0 pg   MCHC 33.7 30.0 - 36.0 g/dL   RDW 13.3 11.5 - 15.5 %   Platelets 280 150 - 400 K/uL   Neutrophils Relative % 55 43 - 77 %   Neutro Abs 2.9 1.7 - 7.7 K/uL   Lymphocytes Relative 37 12 - 46 %   Lymphs Abs 1.9 0.7 - 4.0 K/uL   Monocytes Relative 7 3 - 12 %   Monocytes Absolute 0.4 0.1 - 1.0 K/uL   Eosinophils Relative 1 0 - 5 %   Eosinophils Absolute 0.1 0.0 - 0.7 K/uL   Basophils Relative 0 0 - 1 %   Basophils Absolute 0.0 0.0 - 0.1 K/uL  Comprehensive metabolic panel  Result Value Ref Range   Sodium 138 135 - 145 mmol/L   Potassium 3.9 3.5 - 5.1 mmol/L   Chloride 110 101 - 111 mmol/L   CO2 22 22 - 32 mmol/L   Glucose, Bld 92 65 - 99 mg/dL   BUN 7 6 - 20 mg/dL   Creatinine, Ser 0.61 0.44 - 1.00 mg/dL   Calcium 8.9 8.9 - 10.3 mg/dL   Total Protein 6.6 6.5 - 8.1 g/dL   Albumin 3.8 3.5 - 5.0 g/dL   AST 17 15 - 41 U/L   ALT 13 (L) 14 - 54 U/L   Alkaline  Phosphatase 50 38 - 126 U/L   Total Bilirubin 0.5 0.3 - 1.2 mg/dL   GFR calc non Af Amer >60 >60 mL/min   GFR calc Af Amer >60 >60 mL/min   Anion gap 6 5 - 15  Lipase, blood  Result Value Ref Range   Lipase  27 22 - 51 U/L  Urine microscopic-add on  Result Value Ref Range   Squamous Epithelial / LPF RARE RARE   WBC, UA 0-2 <3 WBC/hpf   RBC / HPF 3-6 <3 RBC/hpf   Bacteria, UA FEW (A) RARE   Urine-Other MUCOUS PRESENT    No results found.  MDM   Final diagnoses:  Pain    Ct no stone,   Pain probably back muscular Rx for robaxin and ibuprofen See your Physician for recheck in 1 week.     [place], PA-C 01/17/15 [place], DO 01/20/15 1156

## 2017-10-05 ENCOUNTER — Ambulatory Visit (HOSPITAL_COMMUNITY)
Admission: EM | Admit: 2017-10-05 | Discharge: 2017-10-05 | Disposition: A | Discharge status: Home / Self Care | Payer: PRIVATE HEALTH INSURANCE | Attending: Family Medicine | Admitting: Family Medicine

## 2017-10-05 ENCOUNTER — Encounter (HOSPITAL_COMMUNITY): Payer: Self-pay | Admitting: Emergency Medicine

## 2017-10-05 DIAGNOSIS — R062 Wheezing: Secondary | ICD-10-CM

## 2017-10-05 DIAGNOSIS — R059 Cough, unspecified: Secondary | ICD-10-CM

## 2017-10-05 DIAGNOSIS — R05 Cough: Secondary | ICD-10-CM

## 2017-10-05 DIAGNOSIS — J209 Acute bronchitis, unspecified: Secondary | ICD-10-CM

## 2017-10-05 DIAGNOSIS — J40 Bronchitis, not specified as acute or chronic: Secondary | ICD-10-CM

## 2017-10-05 DIAGNOSIS — N924 Excessive bleeding in the premenopausal period: Secondary | ICD-10-CM

## 2017-10-05 MED ORDER — IPRATROPIUM-ALBUTEROL 0.5-2.5 (3) MG/3ML IN SOLN
RESPIRATORY_TRACT | Status: AC
  Filled 2017-10-05: qty 3

## 2017-10-05 MED ORDER — AMOXICILLIN-POT CLAVULANATE 875-125 MG PO TABS: 1.0000 | ORAL_TABLET | Freq: Two times a day (BID) | ORAL | 0 refills | Status: AC

## 2017-10-05 MED ORDER — PREDNISONE 10 MG PO TABS: ORAL_TABLET | ORAL | 0 refills | Status: DC

## 2017-10-05 MED ORDER — ALBUTEROL SULFATE HFA 108 (90 BASE) MCG/ACT IN AERS: 2.0000 | INHALATION_SPRAY | Freq: Four times a day (QID) | RESPIRATORY_TRACT | 0 refills | Status: DC | PRN

## 2017-10-05 MED ORDER — BENZONATATE 100 MG PO CAPS: 100.0000 mg | ORAL_CAPSULE | Freq: Three times a day (TID) | ORAL | 0 refills | Status: DC | PRN

## 2017-10-05 MED ORDER — IPRATROPIUM-ALBUTEROL 0.5-2.5 (3) MG/3ML IN SOLN
3.0000 mL | Freq: Once | RESPIRATORY_TRACT | Status: AC
  Administered 2017-10-05: 3 mL via RESPIRATORY_TRACT

## 2017-10-05 NOTE — Discharge Instructions (Addendum)
Recommend start Augmentin 875mg  twice a day for 7 days. Start Prednisone 10mg  tablets- take 5 daily for next 5 days then stop. May take Tessalon cough pills - 1 every 8 hours as needed. Use Albuterol inhaler 2 puffs every 6 hours as needed for wheezing and cough. Encouraged to increase fluid intake and decrease smoking. Recommend follow-up here in 3 to 4 days if not improving. Also recommend [facility] for follow-up on irregular and persistent menstrual bleeding.

## 2017-10-05 NOTE — ED Provider Notes (Signed)
[place]    CSN: [id number] Arrival date & time: 10/05/17  1004     History   Chief Complaint Chief Complaint  Patient presents with  . Cough    HPI Kendra Alvarado is a 52 y.o. female.   52 year old female presents with cough and chest congestion for over 1 month. In the past few days, has become more severe with chills, and coughing up green, dark colored mucus. Also having audible wheezes and shortness of breath. Denies any fever or diarrhea. Has vomited yesterday and today due to mucus. Minimal nasal congestion. Has taken Mucinex DM which made her symptoms worse.  Also experiencing continuous light vaginal bleeding for over 6 weeks. Started having irregular light periods every 3 to 4 months in the past 2 years but now bleeding continuously. Has history of fibroids and uterine artery embolization. Other chronic health issues include anemia. Does not have insurance and Unable to afford medications and further evaluation of irregular persistent vaginal bleeding. Taking OTC Fe, multivitamin, vit D and Aleve prn. No history of asthma but does smoke cigarettes daily.    The history is provided by the patient.    Past Medical History:  Diagnosis Date  . Anemia   . Fibroids    uterine  . HSV-2 (herpes simplex virus 2) infection   . HSV-2 infection     There are no active problems to display for this patient.   Past Surgical History:  Procedure Laterality Date  . TUBAL LIGATION    . UTERINE ARTERY EMBOLIZATION      OB History    No data available       Home Medications    Prior to Admission medications   Medication Sig Start Date End Date Taking? Authorizing Provider  Ferrous Sulfate Dried (FERROUS SULFATE CR PO) Take 1-2 tablets by mouth daily.    Yes [provider]  Multiple Vitamin (MULTIVITAMIN WITH MINERALS) TABS Take 1 tablet by mouth daily.   Yes [provider]  albuterol (PROVENTIL HFA;VENTOLIN HFA) 108 (90 Base) MCG/ACT  inhaler Inhale 2 puffs into the lungs every 6 (six) hours as needed for wheezing or shortness of breath. 10/05/17   Delia Slatten, Nicholes Stairs, NP  amoxicillin-clavulanate (AUGMENTIN) 875-125 MG tablet Take 1 tablet by mouth every 12 (twelve) hours for 7 days. 10/05/17 10/12/17  Katy Apo, NP  benzonatate (TESSALON) 100 MG capsule Take 1 capsule (100 mg total) by mouth 3 (three) times daily as needed for cough. 10/05/17   Katy Apo, NP  naproxen sodium (ALEVE) 220 MG tablet Take 440 mg by mouth 2 (two) times daily.    [provider]  OVER THE COUNTER MEDICATION Take 1 tablet by mouth daily. Stool softener    [provider]  predniSONE (DELTASONE) 10 MG tablet Take 5 tablets by mouth daily for 5 days. 10/05/17   Katy Apo, NP  Vitamin D, Cholecalciferol, 1000 UNITS TABS Take 1-2 tablets by mouth daily.    [provider]    Family History Family History  Problem Relation Age of Onset  . Asthma Mother   . Hypertension Mother   . Heart attack Father     Social History Social History   Tobacco Use  . Smoking status: Current Every Day Smoker    Packs/day: 0.50    Types: Cigarettes  Substance Use Topics  . Alcohol use: No  . Drug use: Yes    Types: Marijuana  Allergies   Patient has no known allergies.   Review of Systems Review of Systems  Constitutional: Positive for appetite change and chills. Negative for activity change, diaphoresis, fatigue and fever.  HENT: Positive for congestion, postnasal drip and rhinorrhea. Negative for ear discharge, ear pain, facial swelling, mouth sores, nosebleeds, sinus pressure, sinus pain, sore throat and trouble swallowing.   Eyes: Negative for pain, discharge, redness and itching.  Respiratory: Positive for cough, chest tightness, shortness of breath and wheezing. Negative for choking and stridor.   Cardiovascular: Negative for chest pain and palpitations.  Gastrointestinal: Positive for nausea and  vomiting. Negative for abdominal pain and diarrhea.  Genitourinary: Positive for menstrual problem and vaginal bleeding. Negative for difficulty urinating, dysuria, flank pain, hematuria, pelvic pain, vaginal discharge and vaginal pain.  Musculoskeletal: Negative for arthralgias, myalgias, neck pain and neck stiffness.  Skin: Negative for rash and wound.  Neurological: Positive for light-headedness and headaches. Negative for dizziness, tremors, seizures, syncope, speech difficulty, weakness and numbness.  Hematological: Negative for adenopathy. Does not bruise/bleed easily.     Physical Exam Triage Vital Signs ED Triage Vitals  Enc Vitals Group     BP 10/05/17 1053 (!) 139/95     Pulse Rate 10/05/17 1053 90     Resp 10/05/17 1053 16     Temp 10/05/17 1053 98.4 F (36.9 C)     Temp Source 10/05/17 1053 Oral     SpO2 10/05/17 1053 98 %     Weight 10/05/17 1057 160 lb (72.6 kg)     Height --      Head Circumference --      Peak Flow --      Pain Score 10/05/17 1056 5     Pain Loc --      Pain Edu? --      Excl. in [place]? --    No data found.  Updated Vital Signs BP (!) 139/95 (BP Location: Left Arm)   Pulse 90   Temp 98.4 F (36.9 C) (Oral)   Resp 16   Wt 160 lb (72.6 kg)   SpO2 98%   BMI 27.46 kg/m   Visual Acuity Right Eye Distance:   Left Eye Distance:   Bilateral Distance:    Right Eye Near:   Left Eye Near:    Bilateral Near:     Physical Exam  Constitutional: She is oriented to person, place, and time. She appears well-developed and well-nourished. She appears ill. No distress.  She appears ill and can hear her wheezing when sitting on exam table but no labored breathing.   HENT:  Head: Normocephalic and atraumatic.  Right Ear: Hearing, tympanic membrane, external ear and ear canal normal.  Left Ear: Hearing, tympanic membrane, external ear and ear canal normal.  Nose: Rhinorrhea present. Right sinus exhibits no maxillary sinus tenderness and no frontal  sinus tenderness. Left sinus exhibits no maxillary sinus tenderness and no frontal sinus tenderness.  Mouth/Throat: Uvula is midline and mucous membranes are normal. Dental caries present. Posterior oropharyngeal erythema present.  Eyes: Conjunctivae and EOM are normal.  Neck: Normal range of motion. Neck supple.  Cardiovascular: Normal rate, regular rhythm and normal heart sounds.  No murmur heard. Pulmonary/Chest: No accessory muscle usage or stridor. No tachypnea. No respiratory distress. She has decreased breath sounds in the right upper field, the right middle field and the right lower field. She has wheezes in the right upper field, the right lower field, the left upper field and the left  lower field. She has rhonchi in the left upper field and the left lower field. She has no rales.  Audible wheezes heard in all lung fields without a stethoscope.   Genitourinary:  Genitourinary Comments: Patient declines pelvic exam today  Musculoskeletal: Normal range of motion. She exhibits no tenderness.  Lymphadenopathy:    She has no cervical adenopathy.  Neurological: She is alert and oriented to person, place, and time.  Skin: Skin is warm and dry. Capillary refill takes less than 2 seconds. No rash noted.  Psychiatric: She has a normal mood and affect. Her behavior is normal. Judgment and thought content normal.     UC Treatments / Results  Labs (all labs ordered are listed, but only abnormal results are displayed) Labs Reviewed - No data to display  EKG  EKG Interpretation None       Radiology No results found.  Procedures Procedures (including critical care time)  Medications Ordered in UC Medications  ipratropium-albuterol (DUONEB) 0.5-2.5 (3) MG/3ML nebulizer solution 3 mL (3 mLs Nebulization Given 10/05/17 1122)     Initial Impression / Assessment and Plan / UC Course  I have reviewed the triage vital signs and the nursing notes.  Pertinent labs & imaging results that  were available during my care of the patient were reviewed by me and considered in my medical decision making (see chart for details).    Patient declines chest x-ray since unable to afford testing. Gave DuoNeb treatment which did help with wheezing. Patient indicated that she could breathe easier and no longer having audible wheezes. Still decreased breath sounds on the right side with more rhonchi on left side. Discussed with patient that she has bronchitis and possibly pneumonia- since unable to perform chest x-ray, can not confirm. Recommend start Augmentin 875mg  twice a day for 7 days. Start Prednisone 10mg  tablets- take 5 daily for next 5 days then stop. May take Tessalon cough pills - 1 every 8 hours as needed. Use Albuterol inhaler 2 puffs every 6 hours as needed for wheezing and cough. Encouraged to increase fluid intake and decrease smoking. Recommend follow-up here in 3 to 4 days if not improving. Also recommend contact [facility] (information provided) for follow-up on irregular and persistent menstrual bleeding.    Final Clinical Impressions(s) / UC Diagnoses   Final diagnoses:  Acute bronchitis, unspecified organism  Cough  Menopausal menorrhagia    ED Discharge Orders        Ordered    amoxicillin-clavulanate (AUGMENTIN) 875-125 MG tablet  Every 12 hours     10/05/17 1239    predniSONE (DELTASONE) 10 MG tablet     10/05/17 1239    benzonatate (TESSALON) 100 MG capsule  3 times daily PRN     10/05/17 1239    albuterol (PROVENTIL HFA;VENTOLIN HFA) 108 (90 Base) MCG/ACT inhaler  Every 6 hours PRN     10/05/17 1239       Controlled Substance Prescriptions [place] Controlled Substance Registry consulted? Not Applicable   Katy Apo, NP 10/06/17 2120

## 2017-10-05 NOTE — ED Triage Notes (Addendum)
PT reports productive cough for 1.5 months. PT reports phlegm is green. PT reports SOB and chills. PT reports menstrual bleeding for 1.5 months as well.

## 2017-10-19 ENCOUNTER — Encounter: Payer: Self-pay | Admitting: Advanced Practice Midwife

## 2017-10-19 ENCOUNTER — Ambulatory Visit (INDEPENDENT_AMBULATORY_CARE_PROVIDER_SITE_OTHER): Payer: Self-pay | Admitting: Advanced Practice Midwife

## 2017-10-19 VITALS — BP 139/77 | HR 76 | Ht 64.0 in | Wt 159.9 lb

## 2017-10-19 DIAGNOSIS — N950 Postmenopausal bleeding: Secondary | ICD-10-CM

## 2017-10-19 DIAGNOSIS — N939 Abnormal uterine and vaginal bleeding, unspecified: Secondary | ICD-10-CM

## 2017-10-19 NOTE — Patient Instructions (Signed)
For primary care:  [facility] [address] [place], [place] [address]  [phone]

## 2017-10-19 NOTE — Progress Notes (Signed)
   GYNECOLOGY PROGRESS NOTE  History:  52 y.o. G4P0 presents to [facility] office today for problem gyn visit. She reports onset of vaginal bleeding 3 months ago. Her LMP prior to this bleeding was 3+ years ago.  The bleeding is not heavy and is not daily but occurs intermittently since onset, 2-3 times/week. Nothing seems to make it worse or better.  It is not associated with pain.  She desires STD testing today.  She denies any vaginal itching/burning/discharge.  She has some intermittent constipation that she has struggled with for years. She denies fatigue, dizziness, fever/chills, or shortness of breath. She does not currently have a primary care provider because she does not have insurance.   The following portions of the patient's history were reviewed and updated as appropriate: allergies, current medications, past family history, past medical history, past social history, past surgical history and problem list. She is unsure of her last Pap but thinks it has been 5+ years.  Review of Systems:  Pertinent items are noted in HPI.   Objective:  Physical Exam Blood pressure 139/77, pulse 76, height 5\' 4"  (1.626 m), weight 159 lb 14.4 oz (72.5 kg). VS reviewed, nursing note reviewed,  Constitutional: well developed, well nourished, no distress HEENT: normocephalic CV: normal rate Pulm/chest wall: normal effort Breast Exam: deferred Abdomen: soft Neuro: alert and oriented x 3 Skin: warm, dry Psych: affect normal Pelvic exam: Deferred  Assessment & Plan:  1. PMB (postmenopausal bleeding) --Discussed recommended Pap, endometrial biopsy, CBC, pelvic US, and STD testing per pt request. --Pt has applied for [facility] discount and is waiting to hear about approval --She has appt scheduled in our office in April --Offered to do any or all of the testing today, or pt can delay and have all testing done when she receives discount.  Pt selects to return when she receives healthcare  discount. --Reviewed importance of following up on postmenopausal bleeding which can be serious.   --Reviewed reasons to seek care sooner.  Printed information given about primary care providers.  2. Abnormal uterine bleeding (AUB)   Fatima Blank, CNM 2:08 PM

## 2017-10-19 NOTE — Progress Notes (Signed)
Pt c/o PM bleeding x 1.5-2 months. VB is not heavy. No period in 3 years. Hx fibroids. Last pap 5 yrs ago per pt. Last MGM 7 yrs ago. Pt requests STD screening today.

## 2017-10-21 DIAGNOSIS — N950 Postmenopausal bleeding: Secondary | ICD-10-CM | POA: Insufficient documentation

## 2017-11-05 ENCOUNTER — Ambulatory Visit (HOSPITAL_COMMUNITY)
Admission: EM | Admit: 2017-11-05 | Discharge: 2017-11-05 | Disposition: A | Discharge status: Home / Self Care | Payer: Self-pay | Attending: Internal Medicine | Admitting: Internal Medicine

## 2017-11-05 ENCOUNTER — Encounter (HOSPITAL_COMMUNITY): Payer: Self-pay | Admitting: Emergency Medicine

## 2017-11-05 ENCOUNTER — Other Ambulatory Visit: Payer: Self-pay

## 2017-11-05 DIAGNOSIS — J069 Acute upper respiratory infection, unspecified: Secondary | ICD-10-CM

## 2017-11-05 DIAGNOSIS — B9789 Other viral agents as the cause of diseases classified elsewhere: Secondary | ICD-10-CM

## 2017-11-05 DIAGNOSIS — X58XXXA Exposure to other specified factors, initial encounter: Secondary | ICD-10-CM | POA: Insufficient documentation

## 2017-11-05 DIAGNOSIS — S025XXA Fracture of tooth (traumatic), initial encounter for closed fracture: Secondary | ICD-10-CM | POA: Insufficient documentation

## 2017-11-05 DIAGNOSIS — F17210 Nicotine dependence, cigarettes, uncomplicated: Secondary | ICD-10-CM | POA: Insufficient documentation

## 2017-11-05 DIAGNOSIS — Z79899 Other long term (current) drug therapy: Secondary | ICD-10-CM | POA: Insufficient documentation

## 2017-11-05 DIAGNOSIS — N950 Postmenopausal bleeding: Secondary | ICD-10-CM | POA: Insufficient documentation

## 2017-11-05 DIAGNOSIS — J029 Acute pharyngitis, unspecified: Secondary | ICD-10-CM | POA: Insufficient documentation

## 2017-11-05 DIAGNOSIS — R05 Cough: Secondary | ICD-10-CM | POA: Insufficient documentation

## 2017-11-05 DIAGNOSIS — K0889 Other specified disorders of teeth and supporting structures: Secondary | ICD-10-CM

## 2017-11-05 LAB — POCT RAPID STREP A: Streptococcus, Group A Screen (Direct): NEGATIVE

## 2017-11-05 MED ORDER — CETIRIZINE HCL 10 MG PO TABS: 10.0000 mg | ORAL_TABLET | Freq: Every day | ORAL | 0 refills | Status: DC

## 2017-11-05 MED ORDER — FLUTICASONE PROPIONATE 50 MCG/ACT NA SUSP: 1.0000 | Freq: Every day | NASAL | 2 refills | Status: DC

## 2017-11-05 MED ORDER — PREDNISONE 20 MG PO TABS: 40.0000 mg | ORAL_TABLET | Freq: Every day | ORAL | 0 refills | Status: DC

## 2017-11-05 MED ORDER — PREDNISONE 20 MG PO TABS: 40.0000 mg | ORAL_TABLET | Freq: Every day | ORAL | 0 refills | Status: AC

## 2017-11-05 NOTE — Discharge Instructions (Signed)
Push fluids to ensure adequate hydration and keep secretions thin.  Tylenol and/or ibuprofen as needed for pain or fevers.  Daily zyrtec, daily flonase and 5 days of prednisone. See provided dental resource

## 2017-11-05 NOTE — ED Provider Notes (Signed)
[name]    CSN: [id number] Arrival date & time: 11/05/17  1231     History   Chief Complaint Chief Complaint  Patient presents with  . Sore Throat  . Dental Problem    HPI Kendra Alvarado is a 52 y.o. female.   Kendra Alvarado prsetns with complaints of cough, sore throat and congestion which has been intermittent for months. States she was seen and treated for similar 1 month ago and had improved some. States she has had congestion for months, however. 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 She states she has started taking allergy medication which has helped some. She does smoke. Has not been using an inhaler. States she has not been short of breath. Takes tylenol and aleve. States she also has dental pain related to broken teeth, does not have a dentist due to lack of insurance. Without contributing medical history.    ROS per HPI.      Past Medical History:  Diagnosis Date  . Anemia   . Fibroids    uterine  . HSV-2 (herpes simplex virus 2) infection   . HSV-2 infection     Patient Active Problem List   Diagnosis Date Noted  . Postmenopausal bleeding 10/21/2017    Past Surgical History:  Procedure Laterality Date  . TUBAL LIGATION    . UTERINE ARTERY EMBOLIZATION      OB History    Gravida  4   Para      Term      Preterm      AB      Living        SAB      TAB      Ectopic      Multiple      Live Births  3            Home Medications    Prior to Admission medications   Medication Sig Start Date End Date Taking? Authorizing Provider  albuterol (PROVENTIL HFA;VENTOLIN HFA) 108 (90 Base) MCG/ACT inhaler Inhale 2 puffs into the lungs every 6 (six) hours as needed for wheezing or shortness of breath. Patient not taking: Reported on 10/19/2017 10/05/17   Katy Apo, NP  cetirizine (ZYRTEC) 10 MG tablet Take 1 tablet (10 mg total) by mouth daily. 11/05/17   Zigmund Gottron, NP    Ferrous Sulfate Dried (FERROUS SULFATE CR PO) Take 1-2 tablets by mouth daily.     [provider]  fluticasone (FLONASE) 50 MCG/ACT nasal spray Place 1 spray into both nostrils daily. 11/05/17   Zigmund Gottron, NP  Multiple Vitamin (MULTIVITAMIN WITH MINERALS) TABS Take 1 tablet by mouth daily.    [provider]  naproxen sodium (ALEVE) 220 MG tablet Take 440 mg by mouth 2 (two) times daily.    [provider]  OVER THE COUNTER MEDICATION Take 1 tablet by mouth daily. Stool softener    [provider]  predniSONE (DELTASONE) 20 MG tablet Take 2 tablets (40 mg total) by mouth daily with breakfast for 5 days. 11/05/17 11/10/17  Zigmund Gottron, NP  Vitamin D, Cholecalciferol, 1000 UNITS TABS Take 1-2 tablets by mouth daily.    [provider]    Family History Family History  Problem Relation Age of Onset  . Asthma Mother   . Hypertension Mother   . Heart attack Father     Social History Social History  Tobacco Use  . Smoking status: Current Every Day Smoker    Packs/day: 0.50    Types: Cigarettes  . Smokeless tobacco: Never Used  Substance Use Topics  . Alcohol use: No  . Drug use: Yes    Types: Marijuana     Allergies   Patient has no known allergies.   Review of Systems Review of Systems   Physical Exam Triage Vital Signs ED Triage Vitals  Enc Vitals Group     BP 11/05/17 1258 135/88     Pulse Rate 11/05/17 1258 72     Resp --      Temp 11/05/17 1258 98.2 F (36.8 C)     Temp Source 11/05/17 1258 Oral     SpO2 11/05/17 1258 98 %     Weight --      Height --      Head Circumference --      Peak Flow --      Pain Score 11/05/17 1256 7     Pain Loc --      Pain Edu? --      Excl. in [place]? --    No data found.  Updated Vital Signs BP 135/88 (BP Location: Left Arm)   Pulse 72   Temp 98.2 F (36.8 C) (Oral)   SpO2 98%   Visual Acuity Right Eye Distance:   Left Eye Distance:   Bilateral Distance:     Right Eye Near:   Left Eye Near:    Bilateral Near:     Physical Exam  Constitutional: She is oriented to person, place, and time. She appears well-developed and well-nourished. No distress.  HENT:  Head: Normocephalic and atraumatic.  Right Ear: Tympanic membrane, external ear and ear canal normal.  Left Ear: Tympanic membrane, external ear and ear canal normal.  Nose: Rhinorrhea present. Right sinus exhibits no maxillary sinus tenderness and no frontal sinus tenderness. Left sinus exhibits no maxillary sinus tenderness and no frontal sinus tenderness.  Mouth/Throat: Uvula is midline, oropharynx is clear and moist and mucous membranes are normal. No posterior oropharyngeal erythema. Tonsils are 1+ on the right. Tonsils are 1+ on the left. No tonsillar exudate.  Multiple broken teeth, primarily left side upper and lower molars  Eyes: Pupils are equal, round, and reactive to light. Conjunctivae and EOM are normal.  Cardiovascular: Normal rate, regular rhythm and normal heart sounds.  Pulmonary/Chest: Effort normal and breath sounds normal.  Neurological: She is alert and oriented to person, place, and time.  Skin: Skin is warm and dry.     UC Treatments / Results  Labs (all labs ordered are listed, but only abnormal results are displayed) Labs Reviewed  CULTURE, GROUP A STREP [facility])  POCT RAPID STREP A    EKG None Radiology No results found.  Procedures Procedures (including critical care time)  Medications Ordered in UC Medications - No data to display   Initial Impression / Assessment and Plan / UC Course  I have reviewed the triage vital signs and the nursing notes.  Pertinent labs & imaging results that were available during my care of the patient were reviewed by me and considered in my medical decision making (see chart for details).     Negative rapid strep. Non toxic in appearance. Viral vs allergic in nature, likely exacerbated by smoking. Encouraged dental  follow up. Return precautions provided. Patient verbalized understanding and agreeable to plan.    Final Clinical Impressions(s) / UC Diagnoses   Final diagnoses:  Viral URI with cough  Pain, dental    ED Discharge Orders        Ordered    cetirizine (ZYRTEC) 10 MG tablet  Daily     11/05/17 1418    fluticasone (FLONASE) 50 MCG/ACT nasal spray  Daily     11/05/17 1418    predniSONE (DELTASONE) 20 MG tablet  Daily with breakfast     11/05/17 1418       Controlled Substance Prescriptions [place] Controlled Substance Registry consulted? Not Applicable   Zigmund Gottron, NP 11/05/17 1427

## 2017-11-05 NOTE — ED Triage Notes (Signed)
C/o sore throat onset 5-7 days. Denies cough, HA or rhinitis.

## 2017-11-05 NOTE — ED Triage Notes (Signed)
States had a tooth break off last PM

## 2017-11-07 LAB — CULTURE, GROUP A STREP (THRC)

## 2017-11-09 ENCOUNTER — Encounter: Payer: Self-pay | Admitting: Obstetrics & Gynecology

## 2017-11-20 ENCOUNTER — Ambulatory Visit (INDEPENDENT_AMBULATORY_CARE_PROVIDER_SITE_OTHER): Payer: Self-pay | Admitting: Obstetrics & Gynecology

## 2017-11-20 ENCOUNTER — Other Ambulatory Visit (HOSPITAL_COMMUNITY)
Admission: RE | Admit: 2017-11-20 | Discharge: 2017-11-20 | Disposition: A | Discharge status: Home / Self Care | Payer: Self-pay | Source: Ambulatory Visit | Attending: Obstetrics & Gynecology | Admitting: Obstetrics & Gynecology

## 2017-11-20 VITALS — BP 129/91 | HR 76 | Ht 64.0 in | Wt 162.2 lb

## 2017-11-20 DIAGNOSIS — N950 Postmenopausal bleeding: Secondary | ICD-10-CM

## 2017-11-20 NOTE — Progress Notes (Signed)
   Subjective:    Patient ID: Kendra Alvarado, female    DOB: Jul 23, 1966, 52 y.o.   MRN: [id number]  HPI  52 yo lady with a h/o fibroids (had a [place] in the past) is here with PMB. Her last normal period was about 3 years ago  Review of Systems She moved here from [place] about 8 years ago.    Objective:   Physical Exam Breathing, conversing, and ambulating normally Well nourished, well hydrated Black female, no apparent distress  UPT negative, consent signed, time out done Cervix prepped with betadine and grasped with a single tooth tenaculum Uterus sounded to 9 cm Pipelle used for 2 passes with a moderate amount of tissue obtained. She tolerated the procedure well.      Assessment & Plan:  PMB- await pathology

## 2017-11-20 NOTE — Progress Notes (Signed)
PHQ9 and GAD7 elevated. Pt declines to see [facility] at this time.

## 2017-11-24 ENCOUNTER — Encounter: Payer: Self-pay | Admitting: *Deleted

## 2017-12-24 ENCOUNTER — Telehealth: Payer: Self-pay

## 2017-12-24 NOTE — Telephone Encounter (Signed)
Pt called requesting test results. 

## 2017-12-28 NOTE — Telephone Encounter (Signed)
I called Kendra Alvarado and she was wanting her endometrial biopsy results. I gave her the results. She denies any further bleeding but has some questions for the provider. I offered her an appointment which she states she would like. I explained registrars will call her with an appt.

## 2018-01-28 ENCOUNTER — Emergency Department (HOSPITAL_COMMUNITY): Payer: Self-pay

## 2018-01-28 ENCOUNTER — Other Ambulatory Visit: Payer: Self-pay

## 2018-01-28 ENCOUNTER — Emergency Department (HOSPITAL_COMMUNITY)
Admission: EM | Admit: 2018-01-28 | Discharge: 2018-01-28 | Disposition: A | Discharge status: Home / Self Care | Payer: Self-pay | Attending: Emergency Medicine | Admitting: Emergency Medicine

## 2018-01-28 DIAGNOSIS — F17210 Nicotine dependence, cigarettes, uncomplicated: Secondary | ICD-10-CM | POA: Insufficient documentation

## 2018-01-28 DIAGNOSIS — Z720 Tobacco use: Secondary | ICD-10-CM

## 2018-01-28 DIAGNOSIS — K029 Dental caries, unspecified: Secondary | ICD-10-CM | POA: Insufficient documentation

## 2018-01-28 MED ORDER — IBUPROFEN 800 MG PO TABS: 800.0000 mg | ORAL_TABLET | Freq: Three times a day (TID) | ORAL | 0 refills | Status: DC | PRN

## 2018-01-28 MED ORDER — AMOXICILLIN 500 MG PO CAPS: 500.0000 mg | ORAL_CAPSULE | Freq: Three times a day (TID) | ORAL | 0 refills | Status: DC

## 2018-01-28 MED ORDER — ALBUTEROL SULFATE HFA 108 (90 BASE) MCG/ACT IN AERS
1.0000 | INHALATION_SPRAY | Freq: Once | RESPIRATORY_TRACT | Status: AC
  Administered 2018-01-28: 2 via RESPIRATORY_TRACT
  Filled 2018-01-28: qty 6.7

## 2018-01-28 NOTE — ED Triage Notes (Signed)
Patient c/o tooth pain - no specific tooth she states multiple teeth.

## 2018-01-28 NOTE — ED Provider Notes (Signed)
Patient placed in Quick Look pathway, seen and evaluated   Chief Complaint: dental pain  HPI:   Left lower jaw, hx of tooth break, hx of intermittent pain, worse today, goddys powder without relief. No fevers ordifficulty swallowing  ROS: dental pain (one)  Physical Exam:   Gen: No distress  Neuro: Awake and Alert  Skin: Warm    Focused Exam: dental decay lower Left molars, no abscess   Initiation of care has begun. The patient has been counseled on the process, plan, and necessity for staying for the completion/evaluation, and the remainder of the medical screening examination    Ned Grace 01/28/18 2024    Dorie Rank, MD 01/31/18 [phone]

## 2018-01-28 NOTE — ED Notes (Signed)
Signature pad not available. Discharge paperwork reviewed using tech back method. Pt stated understanding and all questions were answered.

## 2018-01-28 NOTE — ED Notes (Signed)
Pt requesting CXR, for frequent visits to urgent care for respiratory problems.  EDP made aware.

## 2018-01-28 NOTE — ED Provider Notes (Signed)
[facility] EMERGENCY DEPARTMENT Provider Note   CSN: [id number] Arrival date & time: 01/28/18  2011     History   Chief Complaint Chief Complaint  Patient presents with  . Dental Pain    HPI Kendra Alvarado is a 52 y.o. female.  The history is provided by the patient. No language interpreter was used.  Dental Pain      Kendra Alvarado is a 52 y.o. female who presents to the Emergency Department complaining of dental pain. She reports one year of dental pain across the left lower teeth. Over the last several days her pain has intensified and now it radiates up to her left eye and ear. She feels like there is some swelling in the area. She can swallow without difficulty. She denies any fevers or chills. She feels like her teeth have been breaking in her mouth. She also endorses a cough for the last two months. She continues to smoke cigarettes. She does not have a dentist. She has been using goodie powders for her dental pain with no significant improvement in her symptoms. She takes one good powder a day.  Past Medical History:  Diagnosis Date  . Anemia   . Fibroids    uterine  . HSV-2 (herpes simplex virus 2) infection   . HSV-2 infection     Patient Active Problem List   Diagnosis Date Noted  . Postmenopausal bleeding 10/21/2017    Past Surgical History:  Procedure Laterality Date  . TUBAL LIGATION    . UTERINE ARTERY EMBOLIZATION       OB History    Gravida  4   Para      Term      Preterm      AB      Living        SAB      TAB      Ectopic      Multiple      Live Births  3            Home Medications    Prior to Admission medications   Medication Sig Start Date End Date Taking? Authorizing Provider  albuterol (PROVENTIL HFA;VENTOLIN HFA) 108 (90 Base) MCG/ACT inhaler Inhale 2 puffs into the lungs every 6 (six) hours as needed for wheezing or shortness of breath. Patient not taking: Reported on 10/19/2017 10/05/17    Katy Apo, NP  amoxicillin (AMOXIL) 500 MG capsule Take 1 capsule (500 mg total) by mouth 3 (three) times daily. 01/28/18   Quintella Reichert, MD  cetirizine (ZYRTEC) 10 MG tablet Take 1 tablet (10 mg total) by mouth daily. Patient not taking: Reported on 11/20/2017 11/05/17   Augusto Gamble B, NP  Ferrous Sulfate Dried (FERROUS SULFATE CR PO) Take 1-2 tablets by mouth daily.     [provider]  fluticasone (FLONASE) 50 MCG/ACT nasal spray Place 1 spray into both nostrils daily. Patient not taking: Reported on 11/20/2017 11/05/17   Augusto Gamble B, NP  ibuprofen (ADVIL,MOTRIN) 800 MG tablet Take 1 tablet (800 mg total) by mouth every 8 (eight) hours as needed. 01/28/18   Quintella Reichert, MD  Multiple Vitamin (MULTIVITAMIN WITH MINERALS) TABS Take 1 tablet by mouth daily.    [provider]  naproxen sodium (ALEVE) 220 MG tablet Take 440 mg by mouth 2 (two) times daily.    [provider]  OVER THE COUNTER MEDICATION Take 1 tablet by mouth daily. Stool softener  [provider]  Vitamin D, Cholecalciferol, 1000 UNITS TABS Take 1-2 tablets by mouth daily.    [provider]    Family History Family History  Problem Relation Age of Onset  . Asthma Mother   . Hypertension Mother   . Heart attack Father     Social History Social History   Tobacco Use  . Smoking status: Current Every Day Smoker    Packs/day: 0.50    Types: Cigarettes  . Smokeless tobacco: Never Used  Substance Use Topics  . Alcohol use: No  . Drug use: Yes    Types: Marijuana     Allergies   Patient has no known allergies.   Review of Systems Review of Systems  All other systems reviewed and are negative.    Physical Exam Updated Vital Signs BP (!) 157/94 (BP Location: Right Arm)   Pulse 68   Temp 97.8 F (36.6 C) (Oral)   Resp 19   Ht 5\' 4"  (1.626 m)   Wt 70.3 kg (155 lb)   SpO2 97%   BMI 26.61 kg/m   Physical Exam  Constitutional: She is  oriented to person, place, and time. She appears well-developed and well-nourished.  HENT:  Head: Normocephalic and atraumatic.  Right Ear: External ear normal.  Left Ear: External ear normal.  Mouth/Throat: Uvula is midline and oropharynx is clear and moist. No trismus in the jaw. Dental caries present. No dental abscesses or uvula swelling.    Eyes: Pupils are equal, round, and reactive to light. Conjunctivae and EOM are normal.  Neck: Neck supple. No thyromegaly present.  Cardiovascular: Normal rate and regular rhythm.  No murmur heard. Pulmonary/Chest: Effort normal. No respiratory distress.  Occasional and expiratory wheezes  Abdominal: Soft. There is no tenderness. There is no rebound and no guarding.  Musculoskeletal: She exhibits no edema or tenderness.  Lymphadenopathy:    She has no cervical adenopathy.  Neurological: She is alert and oriented to person, place, and time.  Skin: Skin is warm and dry.  Psychiatric: She has a normal mood and affect. Her behavior is normal.  Nursing note and vitals reviewed.    ED Treatments / Results  Labs (all labs ordered are listed, but only abnormal results are displayed) Labs Reviewed - No data to display  EKG None  Radiology No results found.  Procedures Procedures (including critical care time)  Medications Ordered in ED Medications  albuterol (PROVENTIL HFA;VENTOLIN HFA) 108 (90 Base) MCG/ACT inhaler 1-2 puff (has no administration in time range)     Initial Impression / Assessment and Plan / ED Course  I have reviewed the triage vital signs and the nursing notes.  Pertinent labs & imaging results that were available during my care of the patient were reviewed by me and considered in my medical decision making (see chart for details).    patient here for evaluation of dental pain that is been there for the last year but significantly worsened over the last several days. She is non-toxic appearing on evaluation with no  palpable dental abscess or evidence of significant cellulitis. Discussed with patient home care for dental caries and dental pain, treating for early abscess given her symptoms. Discussed with patient importance of establishing a dentist for close outpatient follow-up. Return precautions discussed.  Patient does report some cough for the last two months, no fevers, weight loss, hemoptysis, chest pain. She does smoke heavily. She does have occasional and expiratory wheezes on exam with no respiratory distress. Discussed  with patients importance of smoking cessation. Will provide MDI for symptom control and instruction in use.   Final Clinical Impressions(s) / ED Diagnoses   Final diagnoses:  Pain due to dental caries  Tobacco abuse    ED Discharge Orders        Ordered    amoxicillin (AMOXIL) 500 MG capsule  3 times daily     01/28/18 2248    ibuprofen (ADVIL,MOTRIN) 800 MG tablet  Every 8 hours PRN     01/28/18 2248       Quintella Reichert, MD 01/28/18 2253

## 2018-02-05 ENCOUNTER — Other Ambulatory Visit: Payer: Self-pay

## 2018-02-05 ENCOUNTER — Encounter: Payer: Self-pay | Admitting: Obstetrics & Gynecology

## 2018-02-05 ENCOUNTER — Telehealth: Payer: Self-pay

## 2018-02-05 ENCOUNTER — Ambulatory Visit (INDEPENDENT_AMBULATORY_CARE_PROVIDER_SITE_OTHER): Payer: Self-pay | Admitting: Obstetrics & Gynecology

## 2018-02-05 VITALS — BP 138/105 | HR 80 | Wt 160.0 lb

## 2018-02-05 DIAGNOSIS — D219 Benign neoplasm of connective and other soft tissue, unspecified: Secondary | ICD-10-CM

## 2018-02-05 DIAGNOSIS — Z113 Encounter for screening for infections with a predominantly sexual mode of transmission: Secondary | ICD-10-CM

## 2018-02-05 DIAGNOSIS — Z1151 Encounter for screening for human papillomavirus (HPV): Secondary | ICD-10-CM

## 2018-02-05 DIAGNOSIS — Z124 Encounter for screening for malignant neoplasm of cervix: Secondary | ICD-10-CM

## 2018-02-05 DIAGNOSIS — R030 Elevated blood-pressure reading, without diagnosis of hypertension: Secondary | ICD-10-CM

## 2018-02-05 DIAGNOSIS — Z0000 Encounter for general adult medical examination without abnormal findings: Secondary | ICD-10-CM

## 2018-02-05 NOTE — Progress Notes (Signed)
   Subjective:    Patient ID: Kendra Alvarado, female    DOB: [birth year], 52 y.o.   MRN: [id number]  HPI  52 yo single P3 here for follow up after an [facility] for PMB. She has a known h/o fibroids. Her EMBX was negative.  She still has occasional spotting. She had an [place] in the distant past. She denies dyspareunia.  Review of Systems     Objective:   Physical Exam Breathing, conversing, and ambulating normally. Her speech was somewhat slow and her eyes are bloodshot. Well nourished, well hydrated Black female, no apparent distress Abd- benign Uterus about 16 week size with bimanual exam Vulva and vagina atrophic Cervix appears normal      Assessment & Plan:  Preventative care- pap smear done today She will be referred to [facility] for mammogram Fibroids- gyn u/s ordered Come back 4 weeks Refer to [facility] med for elevated BP

## 2018-02-05 NOTE — Telephone Encounter (Signed)
[facility] regarding new patient referral.  They say the patient must go to the office and fill out paperwork before she can be scheduled.  I left a message informing patient of this.

## 2018-02-08 LAB — CYTOLOGY - PAP
Chlamydia: NEGATIVE
DIAGNOSIS: NEGATIVE
HPV (WINDOPATH): NOT DETECTED
NEISSERIA GONORRHEA: NEGATIVE

## 2018-02-10 ENCOUNTER — Ambulatory Visit (HOSPITAL_COMMUNITY)
Admission: RE | Admit: 2018-02-10 | Discharge: 2018-02-10 | Disposition: A | Discharge status: Home / Self Care | Payer: Self-pay | Source: Ambulatory Visit | Attending: Obstetrics & Gynecology | Admitting: Obstetrics & Gynecology

## 2018-02-10 DIAGNOSIS — D219 Benign neoplasm of connective and other soft tissue, unspecified: Secondary | ICD-10-CM

## 2018-02-10 DIAGNOSIS — Z9851 Tubal ligation status: Secondary | ICD-10-CM | POA: Insufficient documentation

## 2018-02-10 DIAGNOSIS — D259 Leiomyoma of uterus, unspecified: Secondary | ICD-10-CM | POA: Insufficient documentation

## 2018-02-16 ENCOUNTER — Telehealth (HOSPITAL_COMMUNITY): Payer: Self-pay | Admitting: *Deleted

## 2018-02-16 NOTE — Telephone Encounter (Signed)
Telephoned patient at home number and left message to return call to [facility] 

## 2018-03-01 ENCOUNTER — Ambulatory Visit: Payer: Self-pay | Admitting: Obstetrics & Gynecology

## 2018-05-06 ENCOUNTER — Other Ambulatory Visit: Payer: Self-pay

## 2018-05-06 ENCOUNTER — Emergency Department (HOSPITAL_COMMUNITY): Payer: Self-pay

## 2018-05-06 ENCOUNTER — Emergency Department (HOSPITAL_COMMUNITY)
Admission: EM | Admit: 2018-05-06 | Discharge: 2018-05-06 | Disposition: A | Discharge status: Home / Self Care | Payer: Self-pay | Attending: Emergency Medicine | Admitting: Emergency Medicine

## 2018-05-06 DIAGNOSIS — F17210 Nicotine dependence, cigarettes, uncomplicated: Secondary | ICD-10-CM | POA: Insufficient documentation

## 2018-05-06 DIAGNOSIS — J9801 Acute bronchospasm: Secondary | ICD-10-CM

## 2018-05-06 DIAGNOSIS — J45901 Unspecified asthma with (acute) exacerbation: Secondary | ICD-10-CM | POA: Insufficient documentation

## 2018-05-06 DIAGNOSIS — Z79899 Other long term (current) drug therapy: Secondary | ICD-10-CM | POA: Insufficient documentation

## 2018-05-06 LAB — BASIC METABOLIC PANEL
Anion gap: 8 (ref 5–15)
BUN: 13 mg/dL (ref 6–20)
CHLORIDE: 108 mmol/L (ref 98–111)
CO2: 25 mmol/L (ref 22–32)
Calcium: 8.6 mg/dL — ABNORMAL LOW (ref 8.9–10.3)
Creatinine, Ser: 0.87 mg/dL (ref 0.44–1.00)
GFR calc Af Amer: >60 mL/min (ref >60)
GFR calc non Af Amer: >60 mL/min (ref >60)
GLUCOSE: 100 mg/dL — AB (ref 70–99)
POTASSIUM: 3.5 mmol/L (ref 3.5–5.1)
Sodium: 141 mmol/L (ref 135–145)

## 2018-05-06 LAB — CBC WITH DIFFERENTIAL/PLATELET
ABS IMMATURE GRANULOCYTES: 0 10*3/uL (ref 0.0–0.1)
Basophils Absolute: 0 10*3/uL (ref 0.0–0.1)
Basophils Relative: 0 %
Eosinophils Absolute: 0 10*3/uL (ref 0.0–0.7)
Eosinophils Relative: 1 %
HEMATOCRIT: 43.2 % (ref 36.0–46.0)
HEMOGLOBIN: 14 g/dL (ref 12.0–15.0)
Immature Granulocytes: 1 %
LYMPHS ABS: 1.3 10*3/uL (ref 0.7–4.0)
LYMPHS PCT: 20 %
MCH: 31.2 pg (ref 26.0–34.0)
MCHC: 32.4 g/dL (ref 30.0–36.0)
MCV: 96.2 fL (ref 78.0–100.0)
MONO ABS: 0.4 10*3/uL (ref 0.1–1.0)
Monocytes Relative: 6 %
Neutro Abs: 4.9 10*3/uL (ref 1.7–7.7)
Neutrophils Relative %: 72 %
Platelets: 264 10*3/uL (ref 150–400)
RBC: 4.49 MIL/uL (ref 3.87–5.11)
RDW: 12.8 % (ref 11.5–15.5)
WBC: 6.7 10*3/uL (ref 4.0–10.5)

## 2018-05-06 MED ORDER — PREDNISONE 20 MG PO TABS: 60.0000 mg | ORAL_TABLET | Freq: Every day | ORAL | 0 refills | Status: DC

## 2018-05-06 MED ORDER — IPRATROPIUM-ALBUTEROL 0.5-2.5 (3) MG/3ML IN SOLN: 3.0000 mL | Freq: Once | RESPIRATORY_TRACT | Status: DC

## 2018-05-06 MED ORDER — ALBUTEROL SULFATE HFA 108 (90 BASE) MCG/ACT IN AERS: 2.0000 | INHALATION_SPRAY | RESPIRATORY_TRACT | Status: DC | PRN

## 2018-05-06 MED ORDER — METHYLPREDNISOLONE SODIUM SUCC 125 MG IJ SOLR
125.0000 mg | Freq: Once | INTRAMUSCULAR | Status: AC
  Administered 2018-05-06: 125 mg via INTRAVENOUS
  Filled 2018-05-06: qty 2

## 2018-05-06 MED ORDER — IPRATROPIUM-ALBUTEROL 0.5-2.5 (3) MG/3ML IN SOLN
3.0000 mL | Freq: Once | RESPIRATORY_TRACT | Status: AC
  Administered 2018-05-06: 3 mL via RESPIRATORY_TRACT
  Filled 2018-05-06: qty 3

## 2018-05-06 NOTE — ED Triage Notes (Signed)
Pt stated felt SOB before going to sleep. She stated she took some albuterol medication she has saved at home from when she had a respiratory infection in January. Stated she felt better for a little bit. Denies chest pain, or n/v/d. Pt also has weak  Cough w/small amount of clear/whitish sputum.

## 2018-05-06 NOTE — Discharge Instructions (Addendum)
Return if symptoms are getting worse. °

## 2018-05-06 NOTE — ED Notes (Signed)
Ambulated with patient, patient tolerated well. MD notified

## 2018-05-06 NOTE — ED Provider Notes (Signed)
  Provider Note MRN:  [id number]  Arrival date & time: 05/06/18    ED Course and Medical Decision Making  I received sign out for this patient at shift change from Dr, Roxanne Mins.  Clinical Course as of May 06 826  Thu May 06, 2018  0822 Road test   [MB]    Clinical Course User Index [MB] Kendra Flakes, MD   Patient did well enough with ambulatory oxygenation test.  Saturations did decrease to 89%, but this was after a prolonged walk and this oxygen saturation quickly recovered when she sat back down in bed.  She did not have any significant symptoms during the walk, was well-appearing the entire time.  Still, I explained to the patient that she was right on the borderline of someone that could use admission to the hospital.  She prefers to manage this at home, states that she will return if she does not improve.  Barth Kirks. Sedonia Small, [place] [email or web address]    Kendra Flakes, MD 05/06/18 [phone]

## 2018-05-06 NOTE — ED Provider Notes (Signed)
[facility] EMERGENCY DEPARTMENT Provider Note   CSN: [id number] Arrival date & time: 05/06/18  0320     History   Chief Complaint Chief Complaint  Patient presents with  . Shortness of Breath    HPI Kendra Alvarado is a 52 y.o. female.  The history is provided by the patient.  She has history of anemia and comes in with difficulty breathing which started approximately 30 hours ago.  She has had a cough productive of a small amount of clear sputum.  She denies fever, chills, sweats.  She denies any chest pain.  She did use her albuterol inhaler with very slight relief.  She is a cigarette smoker.  She states she has never had to be admitted to the hospital because of breathing difficulty.  Past Medical History:  Diagnosis Date  . Anemia   . Fibroids    uterine  . HSV-2 (herpes simplex virus 2) infection   . HSV-2 infection     Patient Active Problem List   Diagnosis Date Noted  . Fibroids 02/05/2018  . Postmenopausal bleeding 10/21/2017    Past Surgical History:  Procedure Laterality Date  . TUBAL LIGATION    . UTERINE ARTERY EMBOLIZATION       OB History    Gravida  4   Para      Term      Preterm      AB      Living        SAB      TAB      Ectopic      Multiple      Live Births  3            Home Medications    Prior to Admission medications   Medication Sig Start Date End Date Taking? Authorizing Provider  amoxicillin (AMOXIL) 500 MG capsule Take 1 capsule (500 mg total) by mouth 3 (three) times daily. 01/28/18   Quintella Reichert, MD  Ferrous Sulfate Dried (FERROUS SULFATE CR PO) Take 1-2 tablets by mouth daily.     [provider]  ibuprofen (ADVIL,MOTRIN) 800 MG tablet Take 1 tablet (800 mg total) by mouth every 8 (eight) hours as needed. 01/28/18   Quintella Reichert, MD  Multiple Vitamin (MULTIVITAMIN WITH MINERALS) TABS Take 1 tablet by mouth daily.    [provider]  naproxen sodium (ALEVE) 220  MG tablet Take 440 mg by mouth 2 (two) times daily.    [provider]  OVER THE COUNTER MEDICATION Take 1 tablet by mouth daily. Stool softener    [provider]  Vitamin D, Cholecalciferol, 1000 UNITS TABS Take 1-2 tablets by mouth daily.    [provider]    Family History Family History  Problem Relation Age of Onset  . Asthma Mother   . Hypertension Mother   . Heart attack Father     Social History Social History   Tobacco Use  . Smoking status: Current Every Day Smoker    Packs/day: 0.50    Types: Cigarettes  . Smokeless tobacco: Never Used  Substance Use Topics  . Alcohol use: No  . Drug use: Yes    Types: Marijuana     Allergies   Patient has no known allergies.   Review of Systems Review of Systems  All other systems reviewed and are negative.    Physical Exam Updated Vital Signs BP (!) 161/111   Pulse 89   Temp 98.5 F (  36.9 C) (Oral)   Resp (!) 22   Ht 5\' 4"  (1.626 m)   Wt 72.6 kg   SpO2 99%   BMI 27.46 kg/m   Physical Exam  Nursing note and vitals reviewed.  52 year old female in mild to moderate respiratory distress.  She is using accessory muscles of respiration, but is able to speak in complete sentences without stopping for breath.  Vital signs are significant for rapid respiratory rate and for elevated blood pressure. Oxygen saturation is 99%, which is normal. Head is normocephalic and atraumatic. PERRLA, EOMI. Oropharynx is clear. Neck is nontender and supple without adenopathy or JVD. Back is nontender and there is no CVA tenderness. Lungs have diffuse expiratory wheezes without rales or rhonchi. Chest is nontender. Heart has regular rate and rhythm without murmur. Abdomen is soft, flat, nontender without masses or hepatosplenomegaly and peristalsis is normoactive. Extremities have no cyanosis or edema, full range of motion is present. Skin is warm and dry without rash. Neurologic: Mental status is normal,  cranial nerves are intact, there are no motor or sensory deficits.  ED Treatments / Results  Labs (all labs ordered are listed, but only abnormal results are displayed) Labs Reviewed  BASIC METABOLIC PANEL - Abnormal; Notable for the following components:      Result Value   Glucose, Bld 100 (*)    Calcium 8.6 (*)    All other components within normal limits  CBC WITH DIFFERENTIAL/PLATELET    EKG EKG Interpretation  Date/Time:  Thursday May 06 2018 03:31:41 EDT Ventricular Rate:  113 PR Interval:  144 QRS Duration: 66 QT Interval:  348 QTC Calculation: 477 R Axis:   73 Text Interpretation:  Sinus tachycardia Right atrial enlargement Borderline ECG No old tracing to compare Confirmed by Delora Fuel ([id number]) on 05/06/2018 3:58:06 AM   Radiology Dg Chest Port 1 View  Result Date: 05/06/2018 CLINICAL DATA:  Initial evaluation for acute shortness of breath. EXAM: PORTABLE CHEST 1 VIEW COMPARISON:  Prior radiograph from 01/28/2018. FINDINGS: Cardiomegaly, stable.  Mediastinal silhouette within normal limits. Lung volumes are mildly reduced. Mild perihilar vascular congestion without frank pulmonary edema. No pleural effusion. No focal infiltrates. No pneumothorax. No acute osseous abnormality. IMPRESSION: 1. Cardiomegaly with mild perihilar vascular congestion without overt pulmonary edema. 2. No other active cardiopulmonary disease. Electronically Signed   By: Jeannine Boga M.D.   On: 05/06/2018 04:22    Procedures Procedures   Medications Ordered in ED Medications  ipratropium-albuterol (DUONEB) 0.5-2.5 (3) MG/3ML nebulizer solution 3 mL (has no administration in time range)  methylPREDNISolone sodium succinate (SOLU-MEDROL) 125 mg/2 mL injection 125 mg (has no administration in time range)     Initial Impression / Assessment and Plan / ED Course  I have reviewed the triage vital signs and the nursing notes.  Pertinent labs & imaging results that were available  during my care of the patient were reviewed by me and considered in my medical decision making (see chart for details).  Acute bronchospasm.  Inhaler is left over from a prior episode of bronchitis.  Will check chest x-ray to rule out pneumonia.  She will be given initial dose of methylprednisolone and she will receive a nebulizer treatment with albuterol and ipratropium.  4:57 AM Following nebulizer treatment, she has shown some improvement.  She is no longer using accessory muscles of respiration.  However, lungs continue to show severe wheezes with exhalation.  Chest x-ray shows no evidence of pneumonia.  She will be given  a second nebulizer treatment.  5:56 AM Following second nebulizer treatment, she expressed subjective improvement, but on auscultation she continues to have severe expiratory wheezes.  She will be given a third nebulizer treatment.  Screening labs are unremarkable.  7:30 AM She is feeling much better.  On reexam, there is improved airflow and wheezing is markedly diminished but still present.  Patient states that she would like to go home if possible.  Will check oxygen saturations with ambulation to see if it is safe to discharge her.  Final Clinical Impressions(s) / ED Diagnoses   Final diagnoses:  Acute bronchospasm    ED Discharge Orders    None       Delora Fuel, MD 23/41/44 2247

## 2018-05-06 NOTE — ED Notes (Signed)
D/c reviewed with patient 

## 2020-02-21 ENCOUNTER — Encounter (HOSPITAL_COMMUNITY): Payer: Self-pay

## 2020-02-21 ENCOUNTER — Emergency Department (HOSPITAL_COMMUNITY)
Admission: EM | Admit: 2020-02-21 | Discharge: 2020-02-21 | Disposition: A | Discharge status: Home / Self Care | Payer: 59 | Attending: Emergency Medicine | Admitting: Emergency Medicine

## 2020-02-21 ENCOUNTER — Other Ambulatory Visit: Payer: Self-pay

## 2020-02-21 DIAGNOSIS — Z79899 Other long term (current) drug therapy: Secondary | ICD-10-CM | POA: Insufficient documentation

## 2020-02-21 DIAGNOSIS — K0889 Other specified disorders of teeth and supporting structures: Secondary | ICD-10-CM | POA: Diagnosis present

## 2020-02-21 DIAGNOSIS — K047 Periapical abscess without sinus: Secondary | ICD-10-CM | POA: Diagnosis not present

## 2020-02-21 DIAGNOSIS — F17210 Nicotine dependence, cigarettes, uncomplicated: Secondary | ICD-10-CM | POA: Insufficient documentation

## 2020-02-21 MED ORDER — NAPROXEN 375 MG PO TABS: 375.0000 mg | ORAL_TABLET | Freq: Two times a day (BID) | ORAL | 0 refills | Status: DC

## 2020-02-21 MED ORDER — AMOXICILLIN 500 MG PO CAPS: 500.0000 mg | ORAL_CAPSULE | Freq: Three times a day (TID) | ORAL | 0 refills | Status: DC

## 2020-02-21 NOTE — ED Triage Notes (Addendum)
Pt presents to Ed with complaints of right lower broken tooth with pain and swelling for "a while" that she is unable to get a dentist for. Airway intact. Denies hx of diabetes. Took pain medication yesterday, but none today.

## 2020-02-21 NOTE — ED Provider Notes (Signed)
[facility] EMERGENCY DEPARTMENT Provider Note   CSN: [id number] Arrival date & time: 02/21/20  1154     History Chief Complaint  Patient presents with   Dental Pain    Kendra Alvarado is a 54 y.o. female.  Who presents emergency department chief complaint of dental pain.  She has a history of recurrent dental pain due to a broken right lower third molar.  She is a daily smoker.  Symptoms began several days ago.  She has had swelling just below the angle of the mandible.  She denies trismus, change in voice, fever or difficulty swallowing.  HPI     Past Medical History:  Diagnosis Date   Anemia    Fibroids    uterine   HSV-2 (herpes simplex virus 2) infection    HSV-2 infection     Patient Active Problem List   Diagnosis Date Noted   Fibroids 02/05/2018   Postmenopausal bleeding 10/21/2017    Past Surgical History:  Procedure Laterality Date   TUBAL LIGATION     UTERINE ARTERY EMBOLIZATION       OB History    Gravida  4   Para      Term      Preterm      AB      Living        SAB      TAB      Ectopic      Multiple      Live Births  3           Family History  Problem Relation Age of Onset   Asthma Mother    Hypertension Mother    Heart attack Father     Social History   Tobacco Use   Smoking status: Current Every Day Smoker    Packs/day: 0.50    Types: Cigarettes   Smokeless tobacco: Never Used  Vaping Use   Vaping Use: Some days  Substance Use Topics   Alcohol use: No   Drug use: Yes    Types: Marijuana    Home Medications Prior to Admission medications   Medication Sig Start Date End Date Taking? Authorizing Provider  albuterol (PROVENTIL HFA;VENTOLIN HFA) 108 (90 Base) MCG/ACT inhaler Inhale 1-2 puffs into the lungs every 6 (six) hours as needed for wheezing or shortness of breath.    [provider]  amoxicillin (AMOXIL) 500 MG capsule Take 1 capsule (500 mg total) by  mouth 3 (three) times daily. 02/21/20   Margarita Mail, PA-C  Ferrous Sulfate Dried (FERROUS SULFATE CR PO) Take 1-2 tablets by mouth daily.     [provider]  ibuprofen (ADVIL,MOTRIN) 800 MG tablet Take 1 tablet (800 mg total) by mouth every 8 (eight) hours as needed. Patient taking differently: Take 800 mg by mouth every 8 (eight) hours as needed for moderate pain.  01/28/18   Quintella Reichert, MD  naproxen (NAPROSYN) 375 MG tablet Take 1 tablet (375 mg total) by mouth 2 (two) times daily. 02/21/20   Ziyonna Christner, Vernie Shanks, PA-C  naproxen sodium (ALEVE) 220 MG tablet Take 440 mg by mouth 2 (two) times daily as needed (pain).     [provider]  OVER THE COUNTER MEDICATION Take 1 tablet by mouth daily. Stool softener    [provider]  predniSONE (DELTASONE) 20 MG tablet Take 3 tablets (60 mg total) by mouth daily. 6/59/93   Delora Fuel, MD  Vitamin D, Cholecalciferol, 1000 UNITS TABS Take 1-2  tablets by mouth daily.    [provider]    Allergies    Patient has no known allergies.  Review of Systems   Review of Systems  HENT: Positive for dental problem and facial swelling. Negative for trouble swallowing and voice change.   Genitourinary: Positive for vaginal discharge.    Physical Exam Updated Vital Signs BP (!) 188/114 (BP Location: Left Arm)    Pulse 64    Temp 98.1 F (36.7 C) (Oral)    Resp 15    Ht 5\' 4"  (1.626 m)    Wt 70.3 kg    SpO2 100%    BMI 26.61 kg/m   Physical Exam Vitals and nursing note reviewed.  Constitutional:      General: She is not in acute distress.    Appearance: She is well-developed. She is not diaphoretic.  HENT:     Head: Normocephalic and atraumatic.     Mouth/Throat:     Mouth: Mucous membranes are moist.     Dentition: Dental caries present.     Comments: R facial swelling R 3rd bottom molar cracked with dental decay. No fluctuance. Eyes:     General: No scleral icterus.    Conjunctiva/sclera: Conjunctivae  normal.  Cardiovascular:     Rate and Rhythm: Normal rate and regular rhythm.     Heart sounds: Normal heart sounds. No murmur heard.  No friction rub. No gallop.   Pulmonary:     Effort: Pulmonary effort is normal. No respiratory distress.     Breath sounds: Normal breath sounds.  Abdominal:     General: Bowel sounds are normal. There is no distension.     Palpations: Abdomen is soft. There is no mass.     Tenderness: There is no abdominal tenderness. There is no guarding.  Musculoskeletal:     Cervical back: Normal range of motion.  Skin:    General: Skin is warm and dry.  Neurological:     Mental Status: She is alert and oriented to person, place, and time.  Psychiatric:        Behavior: Behavior normal.     ED Results / Procedures / Treatments   Labs (all labs ordered are listed, but only abnormal results are displayed) Labs Reviewed - No data to display  EKG None  Radiology No results found.  Procedures Procedures (including critical care time)  Medications Ordered in ED Medications - No data to display  ED Course  I have reviewed the triage vital signs and the nursing notes.  Pertinent labs & imaging results that were available during my care of the patient were reviewed by me and considered in my medical decision making (see chart for details).    MDM Rules/Calculators/A&P                         Patient with toothache.  No gross abscess.  Exam unconcerning for Ludwig's angina or spread of infection.  Will treat with amoxil and NSAIDS.  Urged patient to follow-up with dentist.    Final Clinical Impression(s) / ED Diagnoses Final diagnoses:  Dental infection    Rx / DC Orders ED Discharge Orders         Ordered    amoxicillin (AMOXIL) 500 MG capsule  3 times daily     Discontinue  Reprint     02/21/20 1544    naproxen (NAPROSYN) 375 MG tablet  2 times daily     Discontinue  Reprint     02/21/20 [place], [place], PA-C 02/22/20  1007    Daleen Bo, MD 02/22/20 2207

## 2020-02-21 NOTE — Discharge Instructions (Signed)
You have been seen by your caregiver because of dental pain.  SEEK MEDICAL ATTENTION IF: The exam and treatment you received today has been provided on an emergency basis only. This is not a substitute for complete medical or dental care. If your problem worsens or new symptoms (problems) appear, and you are unable to arrange prompt follow-up care with your dentist, call or return to this location. CALL YOUR DENTIST OR RETURN IMMEDIATELY IF you develop a fever, rash, difficulty breathing or swallowing, neck or facial swelling, or other potentially serious concerns.   [facility]  [address]  [place], [place] [address]  Phone [phone]  The [facility] [place], [place], exemplifies the Government social research officer vision to improve the health and quality of life of all [place] by Regulatory affairs officer with a passion to care for the underserved and by leading the nation in community-based, service learning oral health education. We are committed to offering comprehensive general dental services for adults, children and special needs patients in a safe, caring and professional setting.  Appointments: Our clinic is open Monday through Friday 8:00 a.m. until 5:00 p.m. The amount of time scheduled for an appointment depends on the patient's specific needs. We ask that you keep your appointed time for care or provide 24-hour notice of all appointment changes. Parents or legal guardians must accompany minor children.  Payment for Services: Medicaid and other insurance plans are welcome. Payment for services is due when services are rendered and may be made by cash or credit card. If you have dental insurance, we will assist you with your claim submission.   Emergencies: Emergency services will be provided Monday through Friday on a  walk-in basis. Please arrive early for emergency services. After hours emergency services will be provided for patients of record as required.  Services:  Comprehensive General Dentistry  Children's Dentistry  Oral Surgery - Extractions  Root Canals  Sealants and Tooth Colored Fillings  Crowns and Bridges  Dentures and Partial Dentures  Implant Services  Periodontal Services and Cleanings  Cosmetic Statistician  3-D/[facility]

## 2020-02-21 NOTE — ED Notes (Signed)
Pt d/c home per MD order. Discharge summary reviewed with pt, pt verbalizes understanding. No s/s of acute distress noted. Ambulatory off unit.

## 2020-04-29 ENCOUNTER — Emergency Department (HOSPITAL_COMMUNITY)
Admission: EM | Admit: 2020-04-29 | Discharge: 2020-04-29 | Disposition: A | Discharge status: Home / Self Care | Payer: 59 | Attending: Emergency Medicine | Admitting: Emergency Medicine

## 2020-04-29 ENCOUNTER — Encounter (HOSPITAL_COMMUNITY): Payer: Self-pay

## 2020-04-29 ENCOUNTER — Emergency Department (HOSPITAL_COMMUNITY): Payer: 59

## 2020-04-29 DIAGNOSIS — K0889 Other specified disorders of teeth and supporting structures: Secondary | ICD-10-CM

## 2020-04-29 DIAGNOSIS — W228XXA Striking against or struck by other objects, initial encounter: Secondary | ICD-10-CM | POA: Insufficient documentation

## 2020-04-29 DIAGNOSIS — F17210 Nicotine dependence, cigarettes, uncomplicated: Secondary | ICD-10-CM | POA: Insufficient documentation

## 2020-04-29 DIAGNOSIS — M25512 Pain in left shoulder: Secondary | ICD-10-CM | POA: Diagnosis not present

## 2020-04-29 MED ORDER — AMOXICILLIN 500 MG PO CAPS: 500.0000 mg | ORAL_CAPSULE | Freq: Three times a day (TID) | ORAL | 0 refills | Status: DC

## 2020-04-29 MED ORDER — METHOCARBAMOL 500 MG PO TABS: 500.0000 mg | ORAL_TABLET | Freq: Two times a day (BID) | ORAL | 0 refills | Status: DC

## 2020-04-29 MED ORDER — NAPROXEN 375 MG PO TABS: 375.0000 mg | ORAL_TABLET | Freq: Two times a day (BID) | ORAL | 0 refills | Status: DC

## 2020-04-29 MED ORDER — HYDROCODONE-ACETAMINOPHEN 5-325 MG PO TABS
1.0000 | ORAL_TABLET | Freq: Once | ORAL | Status: AC
  Administered 2020-04-29: 1 via ORAL
  Filled 2020-04-29: qty 1

## 2020-04-29 MED ORDER — LIDOCAINE 5 % EX PTCH: 1.0000 | MEDICATED_PATCH | CUTANEOUS | 0 refills | Status: DC

## 2020-04-29 MED ORDER — AMOXICILLIN 500 MG PO CAPS
500.0000 mg | ORAL_CAPSULE | Freq: Once | ORAL | Status: AC
  Administered 2020-04-29: 500 mg via ORAL
  Filled 2020-04-29: qty 1

## 2020-04-29 NOTE — ED Triage Notes (Addendum)
Pt arrives to ED w/ c/o 10/10 L arm pain since Monday. Pt states she hit her shoulder on doorway and has pain radiating from her shoulder to wrist. Pt w/ limited mobility of arm. States it is too painful to move it. Pt also reports dental pain d/t tooth infection.

## 2020-04-29 NOTE — ED Notes (Signed)
Ortho tech made aaware of shoulder immoblizer

## 2020-04-29 NOTE — ED Notes (Signed)
Patient transported to X-ray 

## 2020-04-29 NOTE — Progress Notes (Signed)
Orthopedic Tech Progress Note Patient Details:  Kendra Alvarado 1966-02-08 [id number]  Ortho Devices Type of Ortho Device: Shoulder immobilizer Ortho Device/Splint Location: LUE Ortho Device/Splint Interventions: Ordered, Application, Adjustment   Post Interventions Patient Tolerated: Well Instructions Provided: Care of device   Janit Pagan 04/29/2020, 11:09 AM

## 2020-04-29 NOTE — Discharge Instructions (Signed)
Take the naproxen for your shoulder and dental pain  Take the amoxicillin failure dental infection  May take the muscle relaxers, Robaxin as needed for shoulder pain  I have also given a prescription for lidocaine patches for shoulder pain  Make sure to gently stretch your shoulder to ensure this is not become frozen in place.  Follow-up with orthopedics if you continue to have pain in your shoulder  Follow-up with dentistry.  Dental pain

## 2020-04-29 NOTE — ED Notes (Addendum)
Went to discharge pt; pt had removed her arm sling stating, "I can't wear that, it hurts in that position". Started to go over pt's discharge papers and pt became argumentative and verbally aggressive towards this nurse because he prescriptions weren't sent to pharmacy for her convenience. Sent a message to provider who saw her to tell her that pt wants her prescriptions sent to pharmacy. Pt came up to nurses station cussing at this nurse and continuing verbal aggression. Pt refused obtaining of VS. Notified provider. Pt would not let this nurse go over discharge instructions. The furthest I was able to get with the pt was going over her prescriptions.

## 2020-04-29 NOTE — ED Provider Notes (Signed)
[facility] EMERGENCY DEPARTMENT Provider Note   CSN: [id number] Arrival date & time: 04/29/20  0119     History Chief Complaint  Patient presents with  . Arm Pain    Kendra Alvarado is a 54 y.o. female with no significant past medical history who presents for evaluation of 2 separate complaints.  Patient states she has had right-sided lower dental pain x3 weeks.  Was seen 2 months ago for similar complaints.  Patient states she has recurrent dental infections due to poor dentition and lack of follow-up.  Pain located to right lower molars.  States she has multiple fractured teeth in this area.  Patient states pain worse with hot and cold liquids.  She denies any facial swelling, drooling, dysphagia or trismus.  States her pain is a 9/10.  She has not been able to follow-up with dentistry.  Patient states she is also had left anterior and lateral shoulder pain after hitting her shoulder on a doorway 5 days PTA.  Patient states pain is worse with movement.  Pain does not radiate.  She denies any redness, swelling, warmth to her shoulder.  No history of IV drug use or recent injections into her shoulder.  No midline neck pain or paresthesias radiating into her arm.  No weakness.  She denies any chest pain, shortness of breath or hemoptysis.  Rates her pain an 8/10.  Patient states her pain significantly improves if he does not move it however worse with movement, particularly overhead movement.  She is not taking anything for her pain.  Patient denies fever, chills, nausea vomiting, chest pain, shortness of breath, redness, swelling, warmth to extremities.  No paresthesias or weakness.  History obtained from patient and past medical records.  No interpreter is used.  HPI     Past Medical History:  Diagnosis Date  . Anemia   . Fibroids    uterine  . HSV-2 (herpes simplex virus 2) infection   . HSV-2 infection     Patient Active Problem List   Diagnosis Date Noted    . Fibroids 02/05/2018  . Postmenopausal bleeding 10/21/2017    Past Surgical History:  Procedure Laterality Date  . TUBAL LIGATION    . UTERINE ARTERY EMBOLIZATION       OB History    Gravida  4   Para      Term      Preterm      AB      Living        SAB      TAB      Ectopic      Multiple      Live Births  3           Family History  Problem Relation Age of Onset  . Asthma Mother   . Hypertension Mother   . Heart attack Father     Social History   Tobacco Use  . Smoking status: Current Every Day Smoker    Packs/day: 0.50    Types: Cigarettes  . Smokeless tobacco: Never Used  Vaping Use  . Vaping Use: Some days  Substance Use Topics  . Alcohol use: No  . Drug use: Yes    Types: Marijuana    Home Medications Prior to Admission medications   Medication Sig Start Date End Date Taking? Authorizing Provider  albuterol (PROVENTIL HFA;VENTOLIN HFA) 108 (90 Base) MCG/ACT inhaler Inhale 1-2 puffs into the lungs every 6 (six) hours as needed for  wheezing or shortness of breath.    [provider]  amoxicillin (AMOXIL) 500 MG capsule Take 1 capsule (500 mg total) by mouth 3 (three) times daily. 04/29/20   Arlyce Circle A, PA-C  Ferrous Sulfate Dried (FERROUS SULFATE CR PO) Take 1-2 tablets by mouth daily.     [provider]  ibuprofen (ADVIL,MOTRIN) 800 MG tablet Take 1 tablet (800 mg total) by mouth every 8 (eight) hours as needed. Patient taking differently: Take 800 mg by mouth every 8 (eight) hours as needed for moderate pain.  01/28/18   Quintella Reichert, MD  lidocaine (LIDODERM) 5 % Place 1 patch onto the skin daily. Remove & Discard patch within 12 hours or as directed by MD 04/29/20   Maleea Camilo A, PA-C  methocarbamol (ROBAXIN) 500 MG tablet Take 1 tablet (500 mg total) by mouth 2 (two) times daily. 04/29/20   Jasher Barkan A, PA-C  naproxen (NAPROSYN) 375 MG tablet Take 1 tablet (375 mg total) by mouth 2 (two) times  daily with a meal. 04/29/20   Saylor Murry A, PA-C  naproxen sodium (ALEVE) 220 MG tablet Take 440 mg by mouth 2 (two) times daily as needed (pain).     [provider]  OVER THE COUNTER MEDICATION Take 1 tablet by mouth daily. Stool softener    [provider]  predniSONE (DELTASONE) 20 MG tablet Take 3 tablets (60 mg total) by mouth daily. 9/32/35   Delora Fuel, MD  Vitamin D, Cholecalciferol, 1000 UNITS TABS Take 1-2 tablets by mouth daily.    [provider]    Allergies    Patient has no known allergies.  Review of Systems   Review of Systems  Constitutional: Negative.   HENT: Positive for dental problem. Negative for congestion, ear discharge, ear pain, facial swelling, hearing loss, postnasal drip, rhinorrhea, sinus pressure, sneezing, sore throat and trouble swallowing.   Respiratory: Negative.   Cardiovascular: Negative.   Gastrointestinal: Negative.   Genitourinary: Negative.   Musculoskeletal: Negative for arthralgias, back pain, gait problem, joint swelling, myalgias, neck pain and neck stiffness.       Left shoulder pain  Skin: Negative.   Neurological: Negative.   All other systems reviewed and are negative.   Physical Exam Updated Vital Signs BP (!) 156/109 (BP Location: Right Arm)   Pulse 89   Temp 98.2 F (36.8 C) (Oral)   Resp 17   Ht 5\' 4"  (1.626 m)   Wt 71.7 kg   SpO2 98%   BMI 27.12 kg/m   Physical Exam Vitals and nursing note reviewed.  Constitutional:      General: She is not in acute distress.    Appearance: She is well-developed. She is not ill-appearing, toxic-appearing or diaphoretic.  HENT:     Head: Normocephalic and atraumatic.     Jaw: There is normal jaw occlusion.     Comments: No submandibular edema, erythema or warmth.  No evidence of Ludwig's angina    Nose: Nose normal.     Mouth/Throat:     Mouth: Mucous membranes are moist.     Dentition: Abnormal dentition. Dental tenderness and dental caries  present. No gingival swelling or dental abscesses.     Tongue: No lesions. Tongue does not deviate from midline.     Palate: No mass and lesions.     Pharynx: Oropharynx is clear. Uvula midline.     Tonsils: No tonsillar exudate or tonsillar abscesses.      Comments: Overall poor dentition  with multiple missing teeth and dental caries.  Tenderness palpation with fractured teeth at right lower molars.  No gingival erythema.  No evidence of periapical abscess.  Her sublingual area soft.  She has no drooling, dysphagia or trismus.  No evidence of intraoral edema, infection. Eyes:     Pupils: Pupils are equal, round, and reactive to light.  Neck:     Trachea: Trachea and phonation normal.     Comments: Phonation normal.  No edema, erythema warmth, fluctuance or induration to the neck or submandibular region Cardiovascular:     Rate and Rhythm: Normal rate.     Pulses: Normal pulses.          Radial pulses are 2+ on the right side and 2+ on the left side.     Heart sounds: Normal heart sounds.  Pulmonary:     Effort: Pulmonary effort is normal. No respiratory distress.     Breath sounds: Normal breath sounds and air entry.     Comments: Clear to auscultation by without wheeze, rhonchi or rales.  Speaks in full sentences without difficulty Chest:     Comments: Equal rise and fall to chest wall.  No crepitus, step-offs Abdominal:     General: There is no distension.  Musculoskeletal:     Right shoulder: Normal.     Left shoulder: Tenderness present. No swelling, deformity, effusion, laceration, bony tenderness or crepitus. Decreased range of motion. Normal strength. Normal pulse.     Right upper arm: Normal.     Left upper arm: Normal.     Right elbow: Normal.     Left elbow: Normal.     Right forearm: Normal.     Left forearm: Normal.     Right wrist: Normal.     Left wrist: Normal.     Cervical back: Full passive range of motion without pain and normal range of motion.     Comments:  No midline cervical or thoracic tenderness, crepitus or step-offs.  Tenderness to left lateral and anterior shoulder.  No bony tenderness to midshaft, distal humerus, forearm.  She has equal handgrip bilaterally.  Pain increased to left shoulder greater than 90 degrees.  Unable to perform Hawkins, empty can due to pain.  No overlying skin changes to shoulder, anterior posterior chest wall.  No obvious joint effusion  Lymphadenopathy:     Cervical: No cervical adenopathy.  Skin:    General: Skin is warm and dry.     Capillary Refill: Capillary refill takes less than 2 seconds.     Comments: No edema, erythema or warmth.  No fluctuance or induration.  Neurological:     General: No focal deficit present.     Mental Status: She is alert.     Cranial Nerves: Cranial nerves are intact.     Sensory: Sensation is intact.     Motor: Motor function is intact.     Coordination: Coordination is intact.     Comments: Cranial nerves II through XII grossly intact Intact sensation to bilateral upper extremities without difficulty 5/5 strength bilateral upper extremities without difficulty     ED Results / Procedures / Treatments   Labs (all labs ordered are listed, but only abnormal results are displayed) Labs Reviewed - No data to display  EKG None  Radiology DG Shoulder Left  Result Date: 04/29/2020 CLINICAL DATA:  Left arm pain, injury 6 days ago EXAM: LEFT SHOULDER - 2+ VIEW COMPARISON:  None. FINDINGS: There is no evidence of fracture  or dislocation. There is no evidence of arthropathy or other focal bone abnormality. Soft tissues are unremarkable. IMPRESSION: No fracture or dislocation of the left shoulder. Joint spaces are preserved. Electronically Signed   By: Eddie Candle M.D.   On: 04/29/2020 10:08    Procedures .Ortho Injury Treatment  Date/Time: 04/29/2020 10:17 AM Performed by: Nettie Elm, PA-C Authorized by: Nettie Elm, PA-C   Consent:    Consent obtained:   Verbal   Consent given by:  Patient   Risks discussed:  Fracture, nerve damage, restricted joint movement, vascular damage, stiffness, recurrent dislocation and irreducible dislocation   Alternatives discussed:  No treatment, alternative treatment, immobilization, referral and delayed treatmentInjury location: shoulder Location details: left shoulder Injury type: soft tissue Pre-procedure neurovascular assessment: neurovascularly intact Pre-procedure distal perfusion: normal Pre-procedure neurological function: normal Pre-procedure range of motion: normal  Anesthesia: Local anesthesia used: no  Patient sedated: NoImmobilization: sling (Sling immobilizer) Post-procedure neurovascular assessment: post-procedure neurovascularly intact Post-procedure distal perfusion: normal Post-procedure neurological function: normal Post-procedure range of motion: normal Patient tolerance: patient tolerated the procedure well with no immediate complications    (including critical care time)  Medications Ordered in ED Medications  HYDROcodone-acetaminophen (NORCO/VICODIN) 5-325 MG per tablet 1 tablet (1 tablet Oral Given 04/29/20 0905)  amoxicillin (AMOXIL) capsule 500 mg (500 mg Oral Given 04/29/20 7622)    ED Course  I have reviewed the triage vital signs and the nursing notes.  Pertinent labs & imaging results that were available during my care of the patient were reviewed by me and considered in my medical decision making (see chart for details).  54 year old female presents for evaluation 2 separate complaints.  She is afebrile, nonseptic, non-ill-appearing.  Patient with right lower dental pain x 3 weeks.  Has been seen here multiple times for similar complaints.  She has no drooling, dysphagia or trismus.  She has overall poor dentition with multiple missing teeth and dental caries.  She has fractured dentition to her right posterior lower teeth.  Her sublingual area soft.  She has no drooling,  dysphagia or trismus.  I have low suspicion for Ludwig's angina or deep space infection.  No evidence of drainable perifocal abscess.  We will start her on antibiotics, anti-inflammatories and give resources for dentistry.  Patient also with left shoulder pain after hitting it on a doorway 4-5 days ago.  She has some tenderness to her left anterior and lateral shoulder, particularly with movement overhead.  She has no overlying skin changes.  She is neurovascularly intact.  She has no chest pain, shortness of breath or hemoptysis.  I have low suspicion for atypical cardiopulmonary etiologies such as ACS, PE or dissection as cause of her pain.  Pain is reproducible on exam.  Does have pain with movement however is no overlying skin changes.  Low suspicion for septic joint, gout, hemarthrosis, VTE, myositis, osteomyelitis.  Will obtain x-ray to rule out underlying fracture.  DG left shoulder without fracture, dislocation or effusion  Patient placed in sling.  Discussed anti-inflammatories, muscle relaxers and lidocaine patches for her shoulder.  Discussed taking anti-inflammatories for her dental pain as well as her antibiotics.  She was given resources to follow-up with orthopedics if her symptoms do not resolve and dental resources.  She will return for any worsening symptoms.  The patient has been appropriately medically screened and/or stabilized in the ED. I have low suspicion for any other emergent medical condition which would require further screening, evaluation or treatment in the  ED or require inpatient management.  Patient is hemodynamically stable and in no acute distress.  Patient able to ambulate in department prior to ED.  Evaluation does not show acute pathology that would require ongoing or additional emergent interventions while in the emergency department or further inpatient treatment.  I have discussed the diagnosis with the patient and answered all questions.  Pain is been managed  while in the emergency department and patient has no further complaints prior to discharge.  Patient is comfortable with plan discussed in room and is stable for discharge at this time.  I have discussed strict return precautions for returning to the emergency department.  Patient was encouraged to follow-up with PCP/specialist refer to at discharge.   MDM Rules/Calculators/A&P                           Final Clinical Impression(s) / ED Diagnoses Final diagnoses:  Acute pain of left shoulder  Pain, dental    Rx / DC Orders ED Discharge Orders         Ordered    amoxicillin (AMOXIL) 500 MG capsule  3 times daily        04/29/20 0910    lidocaine (LIDODERM) 5 %  Every 24 hours        04/29/20 0910    methocarbamol (ROBAXIN) 500 MG tablet  2 times daily        04/29/20 0910    naproxen (NAPROSYN) 375 MG tablet  2 times daily with meals        04/29/20 0910           Almadelia Looman A, PA-C 04/29/20 1019    Maudie Flakes, MD 04/29/20 1523

## 2021-06-23 ENCOUNTER — Encounter (HOSPITAL_COMMUNITY): Payer: Self-pay | Admitting: Emergency Medicine

## 2021-06-23 ENCOUNTER — Other Ambulatory Visit: Payer: Self-pay

## 2021-06-23 ENCOUNTER — Emergency Department (HOSPITAL_COMMUNITY): Payer: Self-pay

## 2021-06-23 ENCOUNTER — Emergency Department (HOSPITAL_COMMUNITY)
Admission: EM | Admit: 2021-06-23 | Discharge: 2021-06-23 | Disposition: A | Discharge status: Home / Self Care | Payer: Self-pay | Attending: Emergency Medicine | Admitting: Emergency Medicine

## 2021-06-23 DIAGNOSIS — Z5321 Procedure and treatment not carried out due to patient leaving prior to being seen by health care provider: Secondary | ICD-10-CM | POA: Insufficient documentation

## 2021-06-23 DIAGNOSIS — K0889 Other specified disorders of teeth and supporting structures: Secondary | ICD-10-CM | POA: Insufficient documentation

## 2021-06-23 DIAGNOSIS — R059 Cough, unspecified: Secondary | ICD-10-CM | POA: Insufficient documentation

## 2021-06-23 DIAGNOSIS — M791 Myalgia, unspecified site: Secondary | ICD-10-CM | POA: Insufficient documentation

## 2021-06-23 DIAGNOSIS — R519 Headache, unspecified: Secondary | ICD-10-CM | POA: Insufficient documentation

## 2021-06-23 NOTE — ED Triage Notes (Signed)
C/o R lower dental pain.  Also reports cough, headache, and body aches since yesterday.  Pt refusing COVID swab.

## 2021-06-23 NOTE — ED Notes (Signed)
Pt stormed out after being told the longest wait time. LWBS.

## 2021-06-23 NOTE — ED Provider Notes (Signed)
Emergency Medicine Provider Triage Evaluation Note  Kendra Alvarado , a 55 y.o. female  was evaluated in triage.  Pt complains of headaches, body aches, nausea, vomiting and every other URI symptoms since yesterday.  Also reporting that she has a tooth infection that continues to bother her since 2019.  Has not seen a dentist due to lack of insurance.  Review of Systems  As above  Physical Exam  BP (!) 156/114 (BP Location: Right Arm)   Pulse 96   Temp 98.2 F (36.8 C) (Oral)   Resp 17   SpO2 100%  Gen:   Awake, no distress   Resp:  Normal effort  MSK:   Moves extremities without difficulty  Other:  Teeth in very poor dentition.  Multiple rotted and cracked molars on the right lower side.  Medical Decision Making  Medically screening exam initiated at 1:43 PM.  Appropriate orders placed.  Kendra Alvarado was informed that the remainder of the evaluation will be completed by another provider, this initial triage assessment does not replace that evaluation, and the importance of remaining in the ED until their evaluation is complete.  Refused COVID swab.  Chest x-ray ordered.   Rhae Hammock, PA-C 06/23/21 1345    Godfrey Pick, MD 06/24/21 0003

## 2021-06-24 ENCOUNTER — Ambulatory Visit (HOSPITAL_COMMUNITY)
Admission: EM | Admit: 2021-06-24 | Discharge: 2021-06-24 | Disposition: A | Discharge status: Home / Self Care | Payer: Self-pay | Attending: Physician Assistant | Admitting: Physician Assistant

## 2021-06-24 ENCOUNTER — Encounter (HOSPITAL_COMMUNITY): Payer: Self-pay | Admitting: Emergency Medicine

## 2021-06-24 DIAGNOSIS — K0889 Other specified disorders of teeth and supporting structures: Secondary | ICD-10-CM

## 2021-06-24 DIAGNOSIS — K047 Periapical abscess without sinus: Secondary | ICD-10-CM

## 2021-06-24 DIAGNOSIS — R030 Elevated blood-pressure reading, without diagnosis of hypertension: Secondary | ICD-10-CM

## 2021-06-24 DIAGNOSIS — R220 Localized swelling, mass and lump, head: Secondary | ICD-10-CM

## 2021-06-24 MED ORDER — AMOXICILLIN-POT CLAVULANATE 875-125 MG PO TABS: 1.0000 | ORAL_TABLET | Freq: Two times a day (BID) | ORAL | 0 refills | Status: DC

## 2021-06-24 MED ORDER — PREDNISONE 20 MG PO TABS: 40.0000 mg | ORAL_TABLET | Freq: Every day | ORAL | 0 refills | Status: AC

## 2021-06-24 NOTE — ED Provider Notes (Signed)
[place]    CSN: [id number] Arrival date & time: 06/24/21  3546      History   Chief Complaint Chief Complaint  Patient presents with   Dental Pain    HPI Kendra Alvarado is a 55 y.o. female.   Patient presents today with a several day history of worsening right mandibular swelling and associated dental pain.  Reports pain is rated as intermittent, localized to right lower molars, described as intense aching periodic sharp pains, no alleviating factors identified.  She did go to the emergency room due to symptoms yesterday but had not been seen after 18 hours so left and presented to our clinic today.  She does have a history of broken tooth/dental abscess in this area but has not been able to afford to go to a dentist.  She has taken antibiotics with temporary improvement of symptoms and is requesting this along with steroids if appropriate today.  Denies any difficulty speaking, difficulty swallowing, shortness of breath, swelling of throat/face.  She is eating and drinking despite symptoms.  She has been taking Goody powders without improvement of symptoms.  Blood pressure is very elevated today.  Patient attributes this to recent Goody powder use along with significant pain.  Denies history of hypertension and has not been prescribed antihypertensive medications in the past.  She has been using NSAIDs but denies any recent decongestant use, sodium consumption, caffeine intake.  Denies any chest pain, shortness of breath, vision changes, severe headache.    Past Medical History:  Diagnosis Date   Anemia    Fibroids    uterine   HSV-2 (herpes simplex virus 2) infection    HSV-2 infection     Patient Active Problem List   Diagnosis Date Noted   Fibroids 02/05/2018   Postmenopausal bleeding 10/21/2017    Past Surgical History:  Procedure Laterality Date   TUBAL LIGATION     UTERINE ARTERY EMBOLIZATION      OB History     Gravida  4   Para      Term       Preterm      AB      Living         SAB      IAB      Ectopic      Multiple      Live Births  3            Home Medications    Prior to Admission medications   Medication Sig Start Date End Date Taking? Authorizing Provider  amoxicillin-clavulanate (AUGMENTIN) 875-125 MG tablet Take 1 tablet by mouth every 12 (twelve) hours. 06/24/21  Yes Khris Jansson K, PA-C  predniSONE (DELTASONE) 20 MG tablet Take 2 tablets (40 mg total) by mouth daily for 5 days. 06/24/21 06/29/21 Yes Raesha Coonrod, Derry Skill, PA-C  Ferrous Sulfate Dried (FERROUS SULFATE CR PO) Take 1-2 tablets by mouth daily.     [provider]  ibuprofen (ADVIL,MOTRIN) 800 MG tablet Take 1 tablet (800 mg total) by mouth every 8 (eight) hours as needed. Patient taking differently: Take 800 mg by mouth every 8 (eight) hours as needed for moderate pain.  01/28/18   Quintella Reichert, MD  naproxen sodium (ALEVE) 220 MG tablet Take 440 mg by mouth 2 (two) times daily as needed (pain).     [provider]  OVER THE COUNTER MEDICATION Take 1 tablet by mouth daily. Stool softener    [provider]  Vitamin D, Cholecalciferol, 1000 UNITS TABS Take 1-2 tablets by mouth daily.    [provider]    Family History Family History  Problem Relation Age of Onset   Asthma Mother    Hypertension Mother    Heart attack Father     Social History Social History   Tobacco Use   Smoking status: Every Day    Packs/day: 0.50    Types: Cigarettes   Smokeless tobacco: Never  Vaping Use   Vaping Use: Some days  Substance Use Topics   Alcohol use: No   Drug use: Yes    Types: Marijuana     Allergies   Patient has no known allergies.   Review of Systems Review of Systems  Constitutional:  Positive for activity change. Negative for appetite change, fatigue and fever.  HENT:  Positive for dental problem. Negative for congestion, sinus pressure, sneezing, sore throat, trouble swallowing and  voice change.   Respiratory:  Negative for cough and shortness of breath.   Cardiovascular:  Negative for chest pain.  Gastrointestinal:  Negative for abdominal pain, diarrhea, nausea and vomiting.  Neurological:  Negative for dizziness, light-headedness and headaches.    Physical Exam Triage Vital Signs ED Triage Vitals  Enc Vitals Group     BP 06/24/21 1010 (!) 184/111     Pulse Rate 06/24/21 1010 84     Resp 06/24/21 1010 18     Temp 06/24/21 1011 99.3 F (37.4 C)     Temp Source 06/24/21 1011 Oral     SpO2 06/24/21 1010 95 %     Weight --      Height --      Head Circumference --      Peak Flow --      Pain Score --      Pain Loc --      Pain Edu? --      Excl. in [place]? --    No data found.  Updated Vital Signs BP (!) 170/102 (BP Location: Right Arm)   Pulse 84   Temp 99.3 F (37.4 C) (Oral)   Resp 18   SpO2 95%   Visual Acuity Right Eye Distance:   Left Eye Distance:   Bilateral Distance:    Right Eye Near:   Left Eye Near:    Bilateral Near:     Physical Exam Vitals reviewed.  Constitutional:      General: She is awake. She is not in acute distress.    Appearance: Normal appearance. She is not ill-appearing.  HENT:     Head: Normocephalic and atraumatic.     Right Ear: External ear normal.     Left Ear: External ear normal.     Nose:     Right Sinus: No maxillary sinus tenderness or frontal sinus tenderness.     Left Sinus: No maxillary sinus tenderness or frontal sinus tenderness.     Mouth/Throat:     Dentition: Abnormal dentition. Dental tenderness, gingival swelling, dental caries and dental abscesses present.     Pharynx: Uvula midline. No oropharyngeal exudate or posterior oropharyngeal erythema.      Comments: No evidence of Ludwig angina Cardiovascular:     Rate and Rhythm: Normal rate and regular rhythm.     Heart sounds: Normal heart sounds, S1 normal and S2 normal. No murmur heard. Pulmonary:     Effort: Pulmonary effort is normal.      Breath sounds: Normal breath sounds. No wheezing, rhonchi or rales.  Comments: Clear to auscultation bilaterally Psychiatric:        Behavior: Behavior is cooperative.     UC Treatments / Results  Labs (all labs ordered are listed, but only abnormal results are displayed) Labs Reviewed - No data to display  EKG   Radiology DG Chest 1 View  Result Date: 06/23/2021 CLINICAL DATA:  cough EXAM: CHEST  1 VIEW COMPARISON:  January 28, 2018 FINDINGS: The cardiomediastinal silhouette is unchanged in contour. No pleural effusion. No pneumothorax. No acute pleuroparenchymal abnormality. Visualized abdomen is unremarkable. IMPRESSION: No acute cardiopulmonary abnormality. Electronically Signed   By: Valentino Saxon M.D.   On: 06/23/2021 15:57    Procedures Procedures (including critical care time)  Medications Ordered in UC Medications - No data to display  Initial Impression / Assessment and Plan / UC Course  I have reviewed the triage vital signs and the nursing notes.  Pertinent labs & imaging results that were available during my care of the patient were reviewed by me and considered in my medical decision making (see chart for details).     Dental abscess noted on exam.  Patient started on Augmentin twice daily for 7 days.  She has significant swelling so was started on prednisone burst (40 mg x 5 days).  Discussed that she is not to take NSAIDs with prednisone due to risk of GI bleeding.  Can use Tylenol and warm salt water gargles as needed.  Discussed that she needs to follow-up with a dentist as you will likely have recurrent symptoms until underlying tooth is addressed; she was given contact information for local dentist but unsure if she will be able to be seen due to limited finances.  Discussed alarm symptoms that warrant emergent evaluation.  Return precautions given to which she expressed understanding.  Blood pressure is very elevated today.  Patient attributes this to  pain and NSAID use.  She denies any signs or symptoms of endorgan damage.  Discussed that she is to avoid NSAIDs, caffeine, decongestants, sodium.  She is to monitor blood pressure closely and if this remains above 140/90 she needs to be seen.  If she develops any chest pain, shortness of breath, headache, vision changes, dizziness in the setting of high blood pressure she must go to the ER.  Recommend she follow-up with our clinic or PCP within a week to ensure normalization of BP.  Final Clinical Impressions(s) / UC Diagnoses   Final diagnoses:  Dental infection  Dental abscess  Pain, dental  Facial swelling  Elevated blood pressure reading     Discharge Instructions      Take Augmentin to cover for infection.  Start prednisone to help with swelling.  Do not take NSAIDs including aspirin, ibuprofen/Advil, naproxen/Aleve with this medication because it can cause stomach bleeding.  You can use Tylenol.  It is importantly follow-up with dentist if possible.  If you have any worsening symptoms including difficulty speaking, difficulty swallowing, swelling of your mouth/throat you need to go to the emergency room.  Your blood pressure very high.  Please monitor this at home.  Avoid NSAIDs, caffeine, decongestants, sodium.  If you develop chest pain, headache, dizziness, shortness of breath in the setting of high blood pressure you need to go to the ER.  If your blood pressure remains above 140/90 you need to be reevaluated.  Follow-up with either our clinic or your primary care provider within a week to ensure normalization of blood pressure.     ED Prescriptions  Medication Sig Dispense Auth. Provider   amoxicillin-clavulanate (AUGMENTIN) 875-125 MG tablet Take 1 tablet by mouth every 12 (twelve) hours. 14 tablet Sherron Mapp K, PA-C   predniSONE (DELTASONE) 20 MG tablet Take 2 tablets (40 mg total) by mouth daily for 5 days. 10 tablet Keysean Savino, Derry Skill, PA-C      PDMP not reviewed this  encounter.   Terrilee Croak, PA-C 06/24/21 1033

## 2021-06-24 NOTE — Discharge Instructions (Signed)
Take Augmentin to cover for infection.  Start prednisone to help with swelling.  Do not take NSAIDs including aspirin, ibuprofen/Advil, naproxen/Aleve with this medication because it can cause stomach bleeding.  You can use Tylenol.  It is importantly follow-up with dentist if possible.  If you have any worsening symptoms including difficulty speaking, difficulty swallowing, swelling of your mouth/throat you need to go to the emergency room.  Your blood pressure very high.  Please monitor this at home.  Avoid NSAIDs, caffeine, decongestants, sodium.  If you develop chest pain, headache, dizziness, shortness of breath in the setting of high blood pressure you need to go to the ER.  If your blood pressure remains above 140/90 you need to be reevaluated.  Follow-up with either our clinic or your primary care provider within a week to ensure normalization of blood pressure.

## 2021-06-24 NOTE — ED Triage Notes (Signed)
Had tooth to break twice earlier this year. Having dental pain and swelling.  Reports having mucous in chest since 2019 and needing steroid for it.

## 2021-08-09 ENCOUNTER — Telehealth: Payer: Self-pay | Admitting: Physician Assistant

## 2021-08-09 DIAGNOSIS — K047 Periapical abscess without sinus: Secondary | ICD-10-CM

## 2021-08-09 DIAGNOSIS — S025XXS Fracture of tooth (traumatic), sequela: Secondary | ICD-10-CM

## 2021-08-09 MED ORDER — NAPROXEN SODIUM 550 MG PO TABS: 550.0000 mg | ORAL_TABLET | Freq: Two times a day (BID) | ORAL | 0 refills | Status: DC

## 2021-08-09 MED ORDER — AMOXICILLIN 500 MG PO CAPS: 500.0000 mg | ORAL_CAPSULE | Freq: Three times a day (TID) | ORAL | 0 refills | Status: AC

## 2021-08-09 NOTE — Progress Notes (Signed)

## 2021-09-24 ENCOUNTER — Telehealth: Payer: Self-pay

## 2021-09-24 ENCOUNTER — Telehealth: Payer: Self-pay | Admitting: Emergency Medicine

## 2021-09-24 ENCOUNTER — Telehealth: Payer: Self-pay | Admitting: Family Medicine

## 2021-09-24 DIAGNOSIS — K0889 Other specified disorders of teeth and supporting structures: Secondary | ICD-10-CM

## 2021-09-24 DIAGNOSIS — K047 Periapical abscess without sinus: Secondary | ICD-10-CM

## 2021-09-24 MED ORDER — AMOXICILLIN 500 MG PO CAPS: 500.0000 mg | ORAL_CAPSULE | Freq: Three times a day (TID) | ORAL | 0 refills | Status: AC

## 2021-09-24 NOTE — Progress Notes (Signed)
[place]  Doing VV instead of EV

## 2021-09-24 NOTE — Progress Notes (Signed)
Virtual Visit Consent   Kendra Alvarado, you are scheduled for a virtual visit with a [facility] provider today.     Just as with appointments in the office, your consent must be obtained to participate.  Your consent will be active for this visit and any virtual visit you may have with one of our providers in the next 365 days.     If you have a MyChart account, a copy of this consent can be sent to you electronically.  All virtual visits are billed to your insurance company just like a traditional visit in the office.    As this is a virtual visit, video technology does not allow for your provider to perform a traditional examination.  This may limit your provider's ability to fully assess your condition.  If your provider identifies any concerns that need to be evaluated in person or the need to arrange testing (such as labs, EKG, etc.), we will make arrangements to do so.     Although advances in technology are sophisticated, we cannot ensure that it will always work on either your end or our end.  If the connection with a video visit is poor, the visit may have to be switched to a telephone visit.  With either a video or telephone visit, we are not always able to ensure that we have a secure connection.     I need to obtain your verbal consent now.   Are you willing to proceed with your visit today?    PRISHA HILEY has provided verbal consent on 09/24/2021 for a virtual visit (video or telephone).   Carvel Getting, NP   Date: 09/24/2021 11:13 AM   Virtual Visit via Video Note   I, Carvel Getting, connected with  Kendra Alvarado  ([id number], February 02, 1966) on 09/24/21 at 11:00 AM EST by a video-enabled telemedicine application and verified that I am speaking with the correct person using two identifiers.  Location: Patient: Virtual Visit Location Patient: Home Provider: Virtual Visit Location Provider: Home Office   I discussed the limitations of evaluation and management by  telemedicine and the availability of in person appointments. The patient expressed understanding and agreed to proceed.    History of Present Illness: Kendra Alvarado is a 56 y.o. who identifies as a female who was assigned female at birth, and is being seen today for dental pain. Has 3 broken teeth on right side.  They have been broken and causing her problems since 2019.  She had a an E-visit for the same problem with digital health at [facility] back in December.  The amoxicillin she has had at that time did help temporarily.  However now, her symptoms are back again.  She has pain on the right side of her face related to the broken teeth and an infection around them she thinks.  She says her gums are swollen around them.  She has been gargling and rinsing her mouth with salt water but it has not helped resolve it.  She has tried calling multiple dentist but she says no one will help her since she does not have insurance.  Denies fever or chills.  HPI: HPI  Problems:  Patient Active Problem List   Diagnosis Date Noted   Fibroids 02/05/2018   Postmenopausal bleeding 10/21/2017    Allergies: No Known Allergies Medications:  Current Outpatient Medications:    amoxicillin (AMOXIL) 500 MG capsule, Take 1 capsule (500 mg total) by mouth 3 (three)  times daily for 10 days., Disp: 30 capsule, Rfl: 0   Ferrous Sulfate Dried (FERROUS SULFATE CR PO), Take 1-2 tablets by mouth daily. , Disp: , Rfl:    ibuprofen (ADVIL,MOTRIN) 800 MG tablet, Take 1 tablet (800 mg total) by mouth every 8 (eight) hours as needed. (Patient taking differently: Take 800 mg by mouth every 8 (eight) hours as needed for moderate pain. ), Disp: 21 tablet, Rfl: 0   naproxen sodium (ANAPROX) 550 MG tablet, Take 1 tablet (550 mg total) by mouth 2 (two) times daily with a meal., Disp: 30 tablet, Rfl: 0   OVER THE COUNTER MEDICATION, Take 1 tablet by mouth daily. Stool softener, Disp: , Rfl:    Vitamin D, Cholecalciferol, 1000 UNITS TABS,  Take 1-2 tablets by mouth daily., Disp: , Rfl:   Observations/Objective: Patient is well-developed, well-nourished in no acute distress.  Resting comfortably  at home.  Head is normocephalic, atraumatic.  No labored breathing.  Speech is clear and coherent with logical content.  Patient is alert and oriented at baseline.    Assessment and Plan: 1. Dental infection  Prescribed amoxicillin.  Patient provided names of some dental clinics that may be able to help her.  We discussed that this will keep recurring until she gets her teeth taken care of.  She is very interested in getting help from a dentist but has had trouble finding one that will help her.  Follow Up Instructions: I discussed the assessment and treatment plan with the patient. The patient was provided an opportunity to ask questions and all were answered. The patient agreed with the plan and demonstrated an understanding of the instructions.  A copy of instructions were sent to the patient via MyChart unless otherwise noted below.   The patient was advised to call back or seek an in-person evaluation if the symptoms worsen or if the condition fails to improve as anticipated.  Time:  I spent 8 minutes with the patient via telehealth technology discussing the above problems/concerns.    Carvel Getting, NP

## 2021-09-24 NOTE — Patient Instructions (Addendum)
°  Fritzi Mandes, thank you for joining Carvel Getting, NP for today's virtual visit.  While this provider is not your primary care provider (PCP), if your PCP is located in our provider database this encounter information will be shared with them immediately following your visit.  Consent: (Patient) Kendra Alvarado provided verbal consent for this virtual visit at the beginning of the encounter.  Current Medications:  Current Outpatient Medications:    Ferrous Sulfate Dried (FERROUS SULFATE CR PO), Take 1-2 tablets by mouth daily. , Disp: , Rfl:    ibuprofen (ADVIL,MOTRIN) 800 MG tablet, Take 1 tablet (800 mg total) by mouth every 8 (eight) hours as needed. (Patient taking differently: Take 800 mg by mouth every 8 (eight) hours as needed for moderate pain. ), Disp: 21 tablet, Rfl: 0   naproxen sodium (ANAPROX) 550 MG tablet, Take 1 tablet (550 mg total) by mouth 2 (two) times daily with a meal., Disp: 30 tablet, Rfl: 0   OVER THE COUNTER MEDICATION, Take 1 tablet by mouth daily. Stool softener, Disp: , Rfl:    Vitamin D, Cholecalciferol, 1000 UNITS TABS, Take 1-2 tablets by mouth daily., Disp: , Rfl:    Medications ordered in this encounter:  No orders of the defined types were placed in this encounter.    *If you need refills on other medications prior to your next appointment, please contact your pharmacy*  Follow-Up: Call back or seek an in-person evaluation if the symptoms worsen or if the condition fails to improve as anticipated.  Other Instructions Please follow up with a dentist as soon as possible.   [facility] [phone] extension [phone]  Dr. Donn Pierini [phone] [place] [phone] [address] [facility] [place].  [facility] [phone] extension [phone] [place], [place], [address] First come first serve for the first 10 clients.  May do simple extractions only, no wisdom teeth or surgery.  You may try the  second for Thursday of the month starting at [employer] You may call the school to see if they are still helping to provide dental care for emergent cases.    If you have been instructed to have an in-person evaluation today at a local Urgent Care facility, please use the link below. It will take you to a list of all of our available [facility], including address, phone number and hours of operation. Please do not delay care.  [facility]  If you or a family member do not have a primary care provider, use the link below to schedule a visit and establish care. When you choose a [facility] primary care physician or advanced practice provider, you gain a long-term partner in health. Find a Primary Care Provider  Learn more about [facility] in-office and virtual care options: [facility]

## 2021-10-24 ENCOUNTER — Telehealth: Payer: Self-pay | Admitting: Physician Assistant

## 2021-10-24 DIAGNOSIS — G8929 Other chronic pain: Secondary | ICD-10-CM

## 2021-10-24 DIAGNOSIS — K089 Disorder of teeth and supporting structures, unspecified: Secondary | ICD-10-CM

## 2021-10-24 DIAGNOSIS — K047 Periapical abscess without sinus: Secondary | ICD-10-CM

## 2021-10-24 MED ORDER — AMOXICILLIN 500 MG PO CAPS: 500.0000 mg | ORAL_CAPSULE | Freq: Three times a day (TID) | ORAL | 0 refills | Status: AC

## 2021-10-24 NOTE — Progress Notes (Signed)
E-Visit for Dental Pain ? ?We are sorry that you are not feeling well.  Here is how we plan to help! ? ?Based on what you have shared with me in the questionnaire, it sounds like you have dental infection. ? ?Amoxicillin '500mg'$  3 times per day for 10 days ? ?It is imperative that you see a dentist within 10 days of this eVisit to determine the cause of the dental pain and be sure it is adequately treated ? ?A toothache or tooth pain is caused when the nerve in the root of a tooth or surrounding a tooth is irritated. Dental (tooth) infection, decay, injury, or loss of a tooth are the most common causes of dental pain. Pain may also occur after an extraction (tooth is pulled out). Pain sometimes originates from other areas and radiates to the jaw, thus appearing to be tooth pain.Bacteria growing inside your mouth can contribute to gum disease and dental decay, both of which can cause pain. A toothache occurs from inflammation of the central portion of the tooth called pulp. The pulp contains nerve endings that are very sensitive to pain. Inflammation to the pulp or pulpitis may be caused by dental cavities, trauma, and infection.  ? ? ?HOME CARE:  ? ?For toothaches: ?Over-the-counter pain medications such as acetaminophen or ibuprofen may be used. Take these as directed on the package while you arrange for a dental appointment. ?Avoid very cold or hot foods, because they may make the pain worse. ?You may get relief from biting on a cotton ball soaked in oil of cloves. You can get oil of cloves at most drug stores. ? ?For jaw pain: ? Aspirin may be helpful for problems in the joint of the jaw in adults. ?If pain happens every time you open your mouth widely, the temporomandibular joint (TMJ) may be the source of the pain. Yawning or taking a large bite of food may worsen the pain. An appointment with your doctor or dentist will help you find the cause. ?  ? ? ?GET HELP RIGHT AWAY IF: ? ?You have a high fever or  chills ?If you have had a recent head or face injury and develop headache, light headedness, nausea, vomiting, or other symptoms that concern you after an injury to your face or mouth, you could have a more serious injury in addition to your dental injury. ?A facial rash associated with a toothache: This condition may improve with medication. Contact your doctor for them to decide what is appropriate. ?Any jaw pain occurring with chest pain: Although jaw pain is most commonly caused by dental disease, it is sometimes referred pain from other areas. People with heart disease, especially people who have had stents placed, people with diabetes, or those who have had heart surgery may have jaw pain as a symptom of heart attack or angina. If your jaw or tooth pain is associated with lightheadedness, sweating, or shortness of breath, you should see a doctor as soon as possible. ?Trouble swallowing or excessive pain or bleeding from gums: If you have a history of a weakened immune system, diabetes, or steroid use, you may be more susceptible to infections. Infections can often be more severe and extensive or caused by unusual organisms. Dental and gum infections in people with these conditions may require more aggressive treatment. An abscess may need draining or IV antibiotics, for example. ? ?MAKE SURE YOU  ? ?Understand these instructions. ?Will watch your condition. ?Will get help right away if you are  not doing well or get worse. ? ?Thank you for choosing an e-visit. ? ?Your e-visit answers were reviewed by a board certified advanced clinical practitioner to complete your personal care plan. Depending upon the condition, your plan could have included both over the counter or prescription medications. ? ?Please review your pharmacy choice. Make sure the pharmacy is open so you can pick up prescription now. If there is a problem, you may contact your provider through [employer] and have the prescription routed to  another pharmacy.  Your safety is important to [place]. If you have drug allergies check your prescription carefully.  ? ?For the next 24 hours you can use MyChart to ask questions about today's visit, request a non-urgent call back, or ask for a work or school excuse. ?You will get an email in the next two days asking about your experience. I hope that your e-visit has been valuable and will speed your recovery. ? ? ?I provided 5 minutes of non face-to-face time during this encounter for chart review and documentation.  ? ?

## 2022-01-15 ENCOUNTER — Telehealth: Payer: Self-pay | Admitting: Physician Assistant

## 2022-01-15 DIAGNOSIS — J208 Acute bronchitis due to other specified organisms: Secondary | ICD-10-CM

## 2022-01-15 DIAGNOSIS — K047 Periapical abscess without sinus: Secondary | ICD-10-CM

## 2022-01-15 DIAGNOSIS — B9689 Other specified bacterial agents as the cause of diseases classified elsewhere: Secondary | ICD-10-CM

## 2022-01-15 DIAGNOSIS — R062 Wheezing: Secondary | ICD-10-CM

## 2022-01-15 MED ORDER — PREDNISONE 20 MG PO TABS: 40.0000 mg | ORAL_TABLET | Freq: Every day | ORAL | 0 refills | Status: DC

## 2022-01-15 MED ORDER — BENZONATATE 100 MG PO CAPS: 100.0000 mg | ORAL_CAPSULE | Freq: Three times a day (TID) | ORAL | 0 refills | Status: DC | PRN

## 2022-01-15 MED ORDER — AMOXICILLIN-POT CLAVULANATE 875-125 MG PO TABS: 1.0000 | ORAL_TABLET | Freq: Two times a day (BID) | ORAL | 0 refills | Status: DC

## 2022-01-15 NOTE — Progress Notes (Signed)
We are sorry that you are not feeling well.  Here is how we plan to help!  Based on your presentation I believe you most likely have A cough due to bacteria.  When patients have a fever and a productive cough with a change in color or increased sputum production, we are concerned about bacterial bronchitis.  If left untreated it can progress to pneumonia.  If your symptoms do not improve with your treatment plan it is important that you contact your provider. I have sent in an antibiotic, Augmenting, that will cover for respiratory infection and dental infection.   In addition you may use A prescription cough medication called Tessalon Perles '100mg'$ . You may take 1-2 capsules every 8 hours as needed for your cough. I have also sent in a 5-day burst of prednisone to take as directed.  If things are not resolving with this, you will need an in-person evaluation.   From your responses in the eVisit questionnaire you describe inflammation in the upper respiratory tract which is causing a significant cough.  This is commonly called Bronchitis and has four common causes:   Allergies Viral Infections Acid Reflux Bacterial Infection Allergies, viruses and acid reflux are treated by controlling symptoms or eliminating the cause. An example might be a cough caused by taking certain blood pressure medications. You stop the cough by changing the medication. Another example might be a cough caused by acid reflux. Controlling the reflux helps control the cough.  USE OF BRONCHODILATOR ("RESCUE") INHALERS: There is a risk from using your bronchodilator too frequently.  The risk is that over-reliance on a medication which only relaxes the muscles surrounding the breathing tubes can reduce the effectiveness of medications prescribed to reduce swelling and congestion of the tubes themselves.  Although you feel brief relief from the bronchodilator inhaler, your asthma may actually be worsening with the tubes becoming  more swollen and filled with mucus.  This can delay other crucial treatments, such as oral steroid medications. If you need to use a bronchodilator inhaler daily, several times per day, you should discuss this with your provider.  There are probably better treatments that could be used to keep your asthma under control.     HOME CARE Only take medications as instructed by your medical team. Complete the entire course of an antibiotic. Drink plenty of fluids and get plenty of rest. Avoid close contacts especially the very young and the elderly Cover your mouth if you cough or cough into your sleeve. Always remember to wash your hands A steam or ultrasonic humidifier can help congestion.   GET HELP RIGHT AWAY IF: You develop worsening fever. You become short of breath You cough up blood. Your symptoms persist after you have completed your treatment plan MAKE SURE YOU  Understand these instructions. Will watch your condition. Will get help right away if you are not doing well or get worse.    Thank you for choosing an e-visit.  Your e-visit answers were reviewed by a board certified advanced clinical practitioner to complete your personal care plan. Depending upon the condition, your plan could have included both over the counter or prescription medications.  Please review your pharmacy choice. Make sure the pharmacy is open so you can pick up prescription now. If there is a problem, you may contact your provider through [employer] and have the prescription routed to another pharmacy.  Your safety is important to [place]. If you have drug allergies check your prescription carefully.  For the next 24 hours you can use MyChart to ask questions about today's visit, request a non-urgent call back, or ask for a work or school excuse. You will get an email in the next two days asking about your experience. I hope that your e-visit has been valuable and will speed your recovery.

## 2022-01-15 NOTE — Progress Notes (Signed)
Message sent to patient requesting further input regarding current symptoms. Awaiting patient response.  

## 2022-01-15 NOTE — Progress Notes (Signed)
I have spent 5 minutes in review of e-visit questionnaire, review and updating patient chart, medical decision making and response to patient.   Lourdez Mcgahan Cody Kain Milosevic, PA-C    

## 2022-02-03 ENCOUNTER — Telehealth: Payer: Self-pay | Admitting: Physician Assistant

## 2022-02-03 DIAGNOSIS — K047 Periapical abscess without sinus: Secondary | ICD-10-CM

## 2022-02-03 MED ORDER — CLINDAMYCIN HCL 300 MG PO CAPS: 300.0000 mg | ORAL_CAPSULE | Freq: Four times a day (QID) | ORAL | 0 refills | Status: DC

## 2022-02-03 NOTE — Progress Notes (Signed)

## 2022-10-08 ENCOUNTER — Telehealth: Payer: Self-pay | Admitting: Family Medicine

## 2022-10-08 DIAGNOSIS — J40 Bronchitis, not specified as acute or chronic: Secondary | ICD-10-CM

## 2022-10-08 MED ORDER — ALBUTEROL SULFATE HFA 108 (90 BASE) MCG/ACT IN AERS: 1.0000 | INHALATION_SPRAY | Freq: Four times a day (QID) | RESPIRATORY_TRACT | 0 refills | Status: DC | PRN

## 2022-10-08 MED ORDER — BENZONATATE 100 MG PO CAPS: 100.0000 mg | ORAL_CAPSULE | Freq: Three times a day (TID) | ORAL | 0 refills | Status: DC | PRN

## 2022-10-08 MED ORDER — PREDNISONE 20 MG PO TABS: 40.0000 mg | ORAL_TABLET | Freq: Every day | ORAL | 0 refills | Status: AC

## 2022-10-08 NOTE — Progress Notes (Signed)
We are sorry that you are not feeling well.  Here is how we plan to help!  Based on your presentation I believe you most likely have A cough due to a virus.  This is called viral bronchitis and is best treated by rest, plenty of fluids and control of the cough.  You may use Ibuprofen or Tylenol as directed to help your symptoms.     In addition you may use A prescription cough medication called Tessalon Perles '100mg'$ . You may take 1-2 capsules every 8 hours as needed for your cough.  I will order you a albuterol inhaler. I will send in a burst of prednisone to have incase the inhaler and cough perles do not work.  You also might benefit from flonase or a nasal spray as well   From your responses in the eVisit questionnaire you describe inflammation in the upper respiratory tract which is causing a significant cough.  This is commonly called Bronchitis and has four common causes:   Allergies Viral Infections Acid Reflux Bacterial Infection Allergies, viruses and acid reflux are treated by controlling symptoms or eliminating the cause. An example might be a cough caused by taking certain blood pressure medications. You stop the cough by changing the medication. Another example might be a cough caused by acid reflux. Controlling the reflux helps control the cough.  USE OF BRONCHODILATOR ("RESCUE") INHALERS: There is a risk from using your bronchodilator too frequently.  The risk is that over-reliance on a medication which only relaxes the muscles surrounding the breathing tubes can reduce the effectiveness of medications prescribed to reduce swelling and congestion of the tubes themselves.  Although you feel brief relief from the bronchodilator inhaler, your asthma may actually be worsening with the tubes becoming more swollen and filled with mucus.  This can delay other crucial treatments, such as oral steroid medications. If you need to use a bronchodilator inhaler daily, several times per day, you  should discuss this with your provider.  There are probably better treatments that could be used to keep your asthma under control.     HOME CARE Only take medications as instructed by your medical team. Complete the entire course of an antibiotic. Drink plenty of fluids and get plenty of rest. Avoid close contacts especially the very young and the elderly Cover your mouth if you cough or cough into your sleeve. Always remember to wash your hands A steam or ultrasonic humidifier can help congestion.   GET HELP RIGHT AWAY IF: You develop worsening fever. You become short of breath You cough up blood. Your symptoms persist after you have completed your treatment plan MAKE SURE YOU  Understand these instructions. Will watch your condition. Will get help right away if you are not doing well or get worse.    Thank you for choosing an e-visit.  Your e-visit answers were reviewed by a board certified advanced clinical practitioner to complete your personal care plan. Depending upon the condition, your plan could have included both over the counter or prescription medications.  Please review your pharmacy choice. Make sure the pharmacy is open so you can pick up prescription now. If there is a problem, you may contact your provider through [employer] and have the prescription routed to another pharmacy.  Your safety is important to [place]. If you have drug allergies check your prescription carefully.   For the next 24 hours you can use MyChart to ask questions about today's visit, request a non-urgent call back,  or ask for a work or school excuse. You will get an email in the next two days asking about your experience. I hope that your e-visit has been valuable and will speed your recovery.  I provided 5 minutes of non face-to-face time during this encounter for chart review, medication and order placement, as well as and documentation.

## 2023-04-17 ENCOUNTER — Telehealth: Payer: Medicaid Other | Admitting: Physician Assistant

## 2023-04-17 ENCOUNTER — Telehealth: Payer: Medicaid Other

## 2023-04-17 DIAGNOSIS — R062 Wheezing: Secondary | ICD-10-CM | POA: Diagnosis not present

## 2023-04-17 DIAGNOSIS — K047 Periapical abscess without sinus: Secondary | ICD-10-CM

## 2023-04-17 MED ORDER — ALBUTEROL SULFATE HFA 108 (90 BASE) MCG/ACT IN AERS: 1.0000 | INHALATION_SPRAY | Freq: Four times a day (QID) | RESPIRATORY_TRACT | 0 refills | Status: DC | PRN

## 2023-04-17 MED ORDER — AMOXICILLIN-POT CLAVULANATE 875-125 MG PO TABS: 1.0000 | ORAL_TABLET | Freq: Two times a day (BID) | ORAL | 0 refills | Status: DC

## 2023-04-17 NOTE — Progress Notes (Signed)
E-Visit for Dental Pain  We are sorry that you are not feeling well.  Here is how we plan to help!  Based on what you have shared with me in the questionnaire, it sounds like you have a dental infection/abscess.  Augmentin 875-125mg  twice a day for 7 days  I have also prescribed Albuterol inhaler Use 1-2 puffs every 4-6 hours as needed for wheezing.  It is imperative that you see a dentist within 10 days of this eVisit to determine the cause of the dental pain and be sure it is adequately treated  A toothache or tooth pain is caused when the nerve in the root of a tooth or surrounding a tooth is irritated. Dental (tooth) infection, decay, injury, or loss of a tooth are the most common causes of dental pain. Pain may also occur after an extraction (tooth is pulled out). Pain sometimes originates from other areas and radiates to the jaw, thus appearing to be tooth pain.Bacteria growing inside your mouth can contribute to gum disease and dental decay, both of which can cause pain. A toothache occurs from inflammation of the central portion of the tooth called pulp. The pulp contains nerve endings that are very sensitive to pain. Inflammation to the pulp or pulpitis may be caused by dental cavities, trauma, and infection.    HOME CARE:   For toothaches: Over-the-counter pain medications such as acetaminophen or ibuprofen may be used. Take these as directed on the package while you arrange for a dental appointment. Avoid very cold or hot foods, because they may make the pain worse. You may get relief from biting on a cotton ball soaked in oil of cloves. You can get oil of cloves at most drug stores.  For jaw pain:  Aspirin may be helpful for problems in the joint of the jaw in adults. If pain happens every time you open your mouth widely, the temporomandibular joint (TMJ) may be the source of the pain. Yawning or taking a large bite of food may worsen the pain. An appointment with your doctor or  dentist will help you find the cause.     GET HELP RIGHT AWAY IF:  You have a high fever or chills If you have had a recent head or face injury and develop headache, light headedness, nausea, vomiting, or other symptoms that concern you after an injury to your face or mouth, you could have a more serious injury in addition to your dental injury. A facial rash associated with a toothache: This condition may improve with medication. Contact your doctor for them to decide what is appropriate. Any jaw pain occurring with chest pain: Although jaw pain is most commonly caused by dental disease, it is sometimes referred pain from other areas. People with heart disease, especially people who have had stents placed, people with diabetes, or those who have had heart surgery may have jaw pain as a symptom of heart attack or angina. If your jaw or tooth pain is associated with lightheadedness, sweating, or shortness of breath, you should see a doctor as soon as possible. Trouble swallowing or excessive pain or bleeding from gums: If you have a history of a weakened immune system, diabetes, or steroid use, you may be more susceptible to infections. Infections can often be more severe and extensive or caused by unusual organisms. Dental and gum infections in people with these conditions may require more aggressive treatment. An abscess may need draining or IV antibiotics, for example.  MAKE SURE YOU  Understand these instructions. Will watch your condition. Will get help right away if you are not doing well or get worse.  Thank you for choosing an e-visit.  Your e-visit answers were reviewed by a board certified advanced clinical practitioner to complete your personal care plan. Depending upon the condition, your plan could have included both over the counter or prescription medications.  Please review your pharmacy choice. Make sure the pharmacy is open so you can pick up prescription now. If there is a  problem, you may contact your provider through [employer] and have the prescription routed to another pharmacy.  Your safety is important to [place]. If you have drug allergies check your prescription carefully.   For the next 24 hours you can use MyChart to ask questions about today's visit, request a non-urgent call back, or ask for a work or school excuse. You will get an email in the next two days asking about your experience. I hope that your e-visit has been valuable and will speed your recovery.  I have spent 5 minutes in review of e-visit questionnaire, review and updating patient chart, medical decision making and response to patient.   Margaretann Loveless, PA-C

## 2023-07-03 IMAGING — DX DG CHEST 1V
1 series · 1 of 1 positions shown · non-contrast
Comparison: January 28, 2018

CLINICAL DATA: cough

EXAM:
CHEST  1 VIEW

[chest pa]
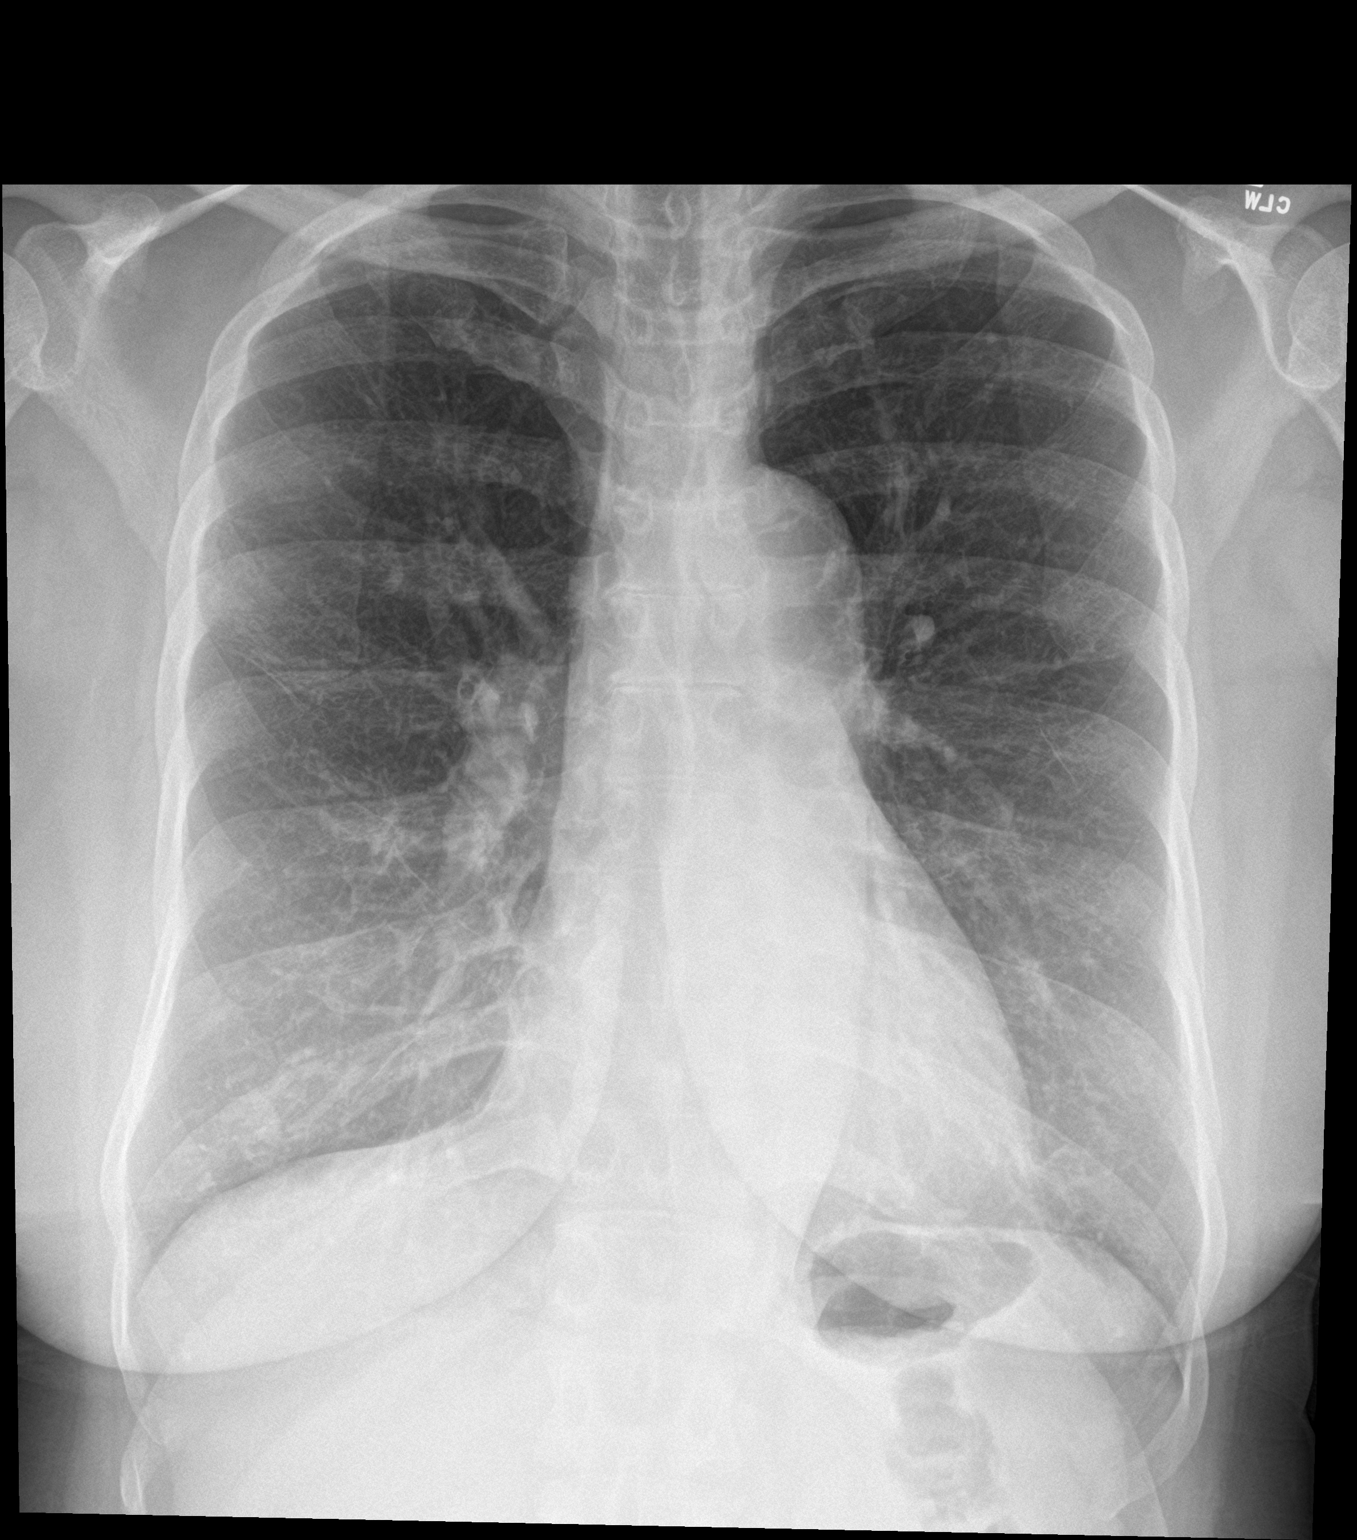

[1 of 1 positions shown; findings below may reference images not displayed]

FINDINGS: The cardiomediastinal silhouette is unchanged in contour. No pleural
effusion. No pneumothorax. No acute pleuroparenchymal abnormality.
Visualized abdomen is unremarkable.
IMPRESSION: No acute cardiopulmonary abnormality.

## 2024-03-05 ENCOUNTER — Telehealth: Payer: No Typology Code available for payment source

## 2024-03-14 ENCOUNTER — Telehealth: Admitting: Nurse Practitioner

## 2024-03-14 ENCOUNTER — Encounter

## 2024-03-14 DIAGNOSIS — K047 Periapical abscess without sinus: Secondary | ICD-10-CM | POA: Diagnosis not present

## 2024-03-14 MED ORDER — PENICILLIN V POTASSIUM 500 MG PO TABS: 500.0000 mg | ORAL_TABLET | Freq: Three times a day (TID) | ORAL | 0 refills | Status: AC

## 2024-03-14 NOTE — Progress Notes (Signed)
 Virtual Primary Care Telehealth Visit  Virtual Visit Consent  Kendra Alvarado, you are scheduled for a virtual visit with a [facility] provider today. Just as with appointments in the office, your consent must be obtained to participate. Your consent will be active for this visit and any virtual visit you may have with one of our providers in the next 365 days. If you have a MyChart account, a copy of this consent can be sent to you electronically.  By engaging in this virtual visit, you consent to the provision of healthcare and authorize for your insurance to be billed (if applicable) for the services provided during this visit. Depending on your insurance coverage, you may receive a charge related to this service.  I need to obtain your verbal consent now. Are you willing to proceed with your visit today? Kendra Alvarado has provided verbal consent on 03/14/2024 for a virtual visit. Kendra Kitty, FNP  Date: 03/14/2024 11:38 AM  Virtual Visit via Video Note   I, Kendra Alvarado, connected with  Kendra Alvarado  ([id number], 11-13-1965) on 03/14/24 at 11:40 AM EDT by a video-enabled telemedicine application and verified that I am speaking with the correct person using two identifiers.  This is an initial visit to discuss the opportunity to become a primary care patient at [facility]  The patient understands that if their medical background is complex, their case will be reviewed with the Medical Director, and if [facility] is not the ideal location for their care, our team will help establish the patient with a primary care provider in the area.   If this is determined that this location is not the best option for the patient, in the future if the patient's medical condition changes we can re explore the option of this location serving as their primary care location.  The patient understands that by becoming a primary care patient, this would be the location for their primary care,  and if they chose to leave this location and seek primary care services at another location they will not be able to continue their relationship with this clinic.    Location: Patient: Virtual Visit Location Patient: [facility] visit location: Home  Provider: Virtual Visit Location Provider: Home Office   History of Present Illness: Kendra Alvarado is a 58 y.o. who identifies as a female who was assigned female at birth, and is being seen today as a new patient with the [facility].   She is requesting a visit today because she believes she has two infected teeth in the back on her right top and bottom. The toothache is associated with a headache. She has been washing out her mouth with salt water.  Denies a fever  She was last seen one year ago for infected teeth.   She has had a difficult time getting a dentist due to her Managed Medicaid    Do you have a current Primary Care Provider? No Have you seen a Primary Care Provider in the past? Yes Do you live in [facility]? No Do you live in the [facility]? No   Problems:  Patient Active Problem List   Diagnosis Date Noted   Fibroids 02/05/2018   Postmenopausal bleeding 10/21/2017    Allergies: No Known Allergies Medications:  Current Outpatient Medications:    albuterol  (VENTOLIN  HFA) 108 (90 Base) MCG/ACT inhaler, Inhale 1-2 puffs into the lungs every 6 (six) hours as needed., Disp: 8 g, Rfl: 0  amoxicillin -clavulanate (AUGMENTIN ) 875-125 MG tablet, Take 1 tablet by mouth 2 (two) times daily., Disp: 14 tablet, Rfl: 0  Observations/Objective: Physical Exam Constitutional:      Appearance: Normal appearance.  HENT:     Nose: Nose normal.     Mouth/Throat:     Mouth: Mucous membranes are moist.     Dentition: Abnormal dentition. Dental tenderness present. No gingival swelling or dental caries.   Pulmonary:     Effort: Pulmonary effort is normal.  Neurological:     Mental Status: She is alert.  Mental status is at baseline.  Psychiatric:        Mood and Affect: Mood normal.       Assessment and Plan:   1. Dental infection (Primary)  Meds ordered this encounter  Medications   penicillin  v potassium (VEETID) 500 MG tablet    Sig: Take 1 tablet (500 mg total) by mouth 3 (three) times daily for 10 days.    Dispense:  30 tablet    Refill:  0    Scheduled physical for 03/28/24   Will send list of dentists in network to patient via Mychart   Follow Up Instructions: I discussed the assessment and treatment plan with the patient. The Telepresenter provided patient with a physical copy of my written instructions for review.   The patient was advised to call back or seek an in-person evaluation if the symptoms worsen or if the condition fails to improve as anticipated.    Kendra Kitty, FNP  **Disclaimer: This note may have been dictated with voice recognition software. Similar sounding words can inadvertently be transcribed and this note may contain transcription errors which may not have been corrected upon publication of note.**

## 2024-03-14 NOTE — Patient Instructions (Signed)
 We are referring you to the following Dentist:  [facility] [address], [place], [place] [address]

## 2024-03-28 ENCOUNTER — Ambulatory Visit: Admitting: Nurse Practitioner

## 2024-03-31 ENCOUNTER — Telehealth (INDEPENDENT_AMBULATORY_CARE_PROVIDER_SITE_OTHER): Admitting: Nurse Practitioner

## 2024-03-31 ENCOUNTER — Other Ambulatory Visit (HOSPITAL_COMMUNITY): Payer: Self-pay

## 2024-03-31 VITALS — BP 171/108 | HR 69 | Resp 16 | Ht 64.0 in | Wt 156.0 lb

## 2024-03-31 DIAGNOSIS — Z0000 Encounter for general adult medical examination without abnormal findings: Secondary | ICD-10-CM

## 2024-03-31 DIAGNOSIS — Z1159 Encounter for screening for other viral diseases: Secondary | ICD-10-CM

## 2024-03-31 DIAGNOSIS — T7840XA Allergy, unspecified, initial encounter: Secondary | ICD-10-CM

## 2024-03-31 DIAGNOSIS — K029 Dental caries, unspecified: Secondary | ICD-10-CM | POA: Diagnosis not present

## 2024-03-31 DIAGNOSIS — F17200 Nicotine dependence, unspecified, uncomplicated: Secondary | ICD-10-CM | POA: Diagnosis not present

## 2024-03-31 DIAGNOSIS — Z1211 Encounter for screening for malignant neoplasm of colon: Secondary | ICD-10-CM | POA: Diagnosis not present

## 2024-03-31 DIAGNOSIS — F17210 Nicotine dependence, cigarettes, uncomplicated: Secondary | ICD-10-CM | POA: Diagnosis not present

## 2024-03-31 DIAGNOSIS — J440 Chronic obstructive pulmonary disease with acute lower respiratory infection: Secondary | ICD-10-CM

## 2024-03-31 DIAGNOSIS — K047 Periapical abscess without sinus: Secondary | ICD-10-CM

## 2024-03-31 DIAGNOSIS — I10 Essential (primary) hypertension: Secondary | ICD-10-CM

## 2024-03-31 DIAGNOSIS — J209 Acute bronchitis, unspecified: Secondary | ICD-10-CM

## 2024-03-31 DIAGNOSIS — J42 Unspecified chronic bronchitis: Secondary | ICD-10-CM

## 2024-03-31 MED ORDER — LORATADINE 10 MG PO TABS
10.0000 mg | ORAL_TABLET | Freq: Every day | ORAL | 2 refills | Status: AC
  Filled 2024-03-31: qty 30, 30d supply, fill #0
  Filled 2024-05-19: qty 30, 30d supply, fill #1

## 2024-03-31 MED ORDER — BUDESONIDE-FORMOTEROL FUMARATE 160-4.5 MCG/ACT IN AERO
2.0000 | INHALATION_SPRAY | Freq: Two times a day (BID) | RESPIRATORY_TRACT | 3 refills | Status: DC
  Filled 2024-03-31: qty 10.2, 30d supply, fill #0

## 2024-03-31 MED ORDER — CHLORHEXIDINE GLUCONATE 0.12 % MT SOLN: 15.0000 mL | Freq: Two times a day (BID) | OROMUCOSAL | 1 refills | Status: DC

## 2024-03-31 MED ORDER — AMOXICILLIN 500 MG PO CAPS
500.0000 mg | ORAL_CAPSULE | Freq: Three times a day (TID) | ORAL | 0 refills | Status: AC
  Filled 2024-03-31: qty 30, 10d supply, fill #0

## 2024-03-31 MED ORDER — CHLORHEXIDINE GLUCONATE 0.12 % MT SOLN
15.0000 mL | Freq: Two times a day (BID) | OROMUCOSAL | 1 refills | Status: AC
  Filled 2024-03-31: qty 473, 16d supply, fill #0
  Filled 2024-05-19: qty 473, 16d supply, fill #1

## 2024-03-31 NOTE — Progress Notes (Signed)
 Pt presents to establish care  -dealing w/excessive mucous for few years

## 2024-03-31 NOTE — Patient Instructions (Addendum)
 Dentist to call- Nikki GUTTING [facility] [address] [place], [place] [address] [phone]    Ct scan for lungs  [facility]  [phone]  Pharmacy for delivery  [phone]

## 2024-03-31 NOTE — Progress Notes (Signed)
 Annual Physical Exam   Location: Patient: in office  Provider: In office     Patient: Kendra Alvarado   DOB: [birth year]   58 y.o. Female  MRN: [id number]  Subjective:    Chief Complaint  Patient presents with   Establish Care    Kendra Alvarado is a 58 y.o. female who presents today for a complete physical exam. She reports consuming a general diet. Does not exercise often but spends a lot of time on her feet She generally feels fairly well. She reports sleeping poorly. She does have additional problems to discuss today.    She has a difficult time falling asleep and will be awake until 3-4am at times.    Patient has been without a PCP in recent years. She has had frequent appointments with [facility] for various complaints in recent months. She suffers from a chronic cough that she states has been present since 2018. Has recurrent dental infections. She has had a difficult time finding a dentist that accepts Medicaid   Most recent antibiotic was one week ago for dental infection   She smokes one pack of cigarettes a day, she has smoked for 45 years.  Between/2 pack to full pack a day .  Increased greatly after her husband passed 8 years ago   Denies a known history of HTN, DM,   She went through menopause at 58   She has not been treated for anxiety in the past   In general she reports that she is not a sickly person and is hesitant to perform scheduled health maintenance, but she would also like to know what is wrong with her today   Most recent fall risk assessment:    03/31/2024    9:50 AM  Fall Risk   Falls in the past year? 1  Number falls in past yr: 0  Injury with Fall? 0  Risk for fall due to : Impaired balance/gait  Follow up Education provided;Falls prevention discussed     Most recent depression screenings:    03/31/2024    9:36 AM 11/20/2017   10:25 AM  PHQ 2/9 Scores  PHQ - 2 Score 1 3  PHQ- 9 Score 3 16       Patient Active Problem List   Diagnosis Date Noted   Fibroids 02/05/2018   Past Medical History:  Diagnosis Date   Anemia    Fibroids    uterine   HSV-2 (herpes simplex virus 2) infection    HSV-2 infection       Patient Care Team: Kennyth Domino, FNP as PCP - General (Nurse Practitioner)   Outpatient Medications Prior to Visit  Medication Sig   albuterol  (VENTOLIN  HFA) 108 (90 Base) MCG/ACT inhaler Inhale 1-2 puffs into the lungs as needed for wheezing or shortness of breath.   No facility-administered medications prior to visit.    Review of Systems  Constitutional: Negative.   HENT: Negative.    Respiratory:  Positive for cough.   Cardiovascular: Negative.   Skin: Negative.   Psychiatric/Behavioral:  The patient is nervous/anxious.           Objective:     BP (!) 171/108 (BP Location: Left Arm, Patient Position: Sitting, Cuff Size: Normal)   Pulse  69   Resp 16   Ht 5' 4 (1.626 m)   Wt 156 lb (70.8 kg)   SpO2 96%   BMI 26.78 kg/m  BP Readings from Last 3 Encounters:  03/31/24 (!) 171/108  06/24/21 (!) 170/102  06/23/21 (!) 150/115      Physical Exam Constitutional:      General: She is not in acute distress.    Appearance: Normal appearance.  HENT:     Nose: Nose normal.     Mouth/Throat:     Dentition: Abnormal dentition. Dental caries present.  Eyes:     Extraocular Movements: Extraocular movements intact.  Cardiovascular:     Rate and Rhythm: Normal rate and regular rhythm.     Heart sounds: Normal heart sounds.  Pulmonary:     Effort: No respiratory distress.     Breath sounds: Wheezing and rhonchi present.  Abdominal:     Palpations: Abdomen is soft.  Musculoskeletal:     Cervical back: Neck supple.  Skin:    General: Skin is warm.  Neurological:     General: No focal deficit present.     Mental Status: She is alert and oriented to person, place, and time.  Psychiatric:        Mood and Affect: Mood normal.           Assessment & Plan:    Routine Health Maintenance and Physical Exam   There is no immunization history on file for this patient.  Health Maintenance  Topic Date Due   Hepatitis C Screening  Never done   DTaP/Tdap/Td (1 - Tdap) Never done   Pneumococcal Vaccine: 50+ Years (1 of 2 - PCV) Never done   Colonoscopy  Never done   Lung Cancer Screening  Never done   MAMMOGRAM  08/17/2013   Zoster Vaccines- Shingrix (1 of 2) Never done   Cervical Cancer Screening (HPV/Pap Cotest)  02/06/2023   COVID-19 Vaccine (1 - 2024-25 season) Never done   INFLUENZA VACCINE  03/11/2024   HIV Screening  Completed   Hepatitis B Vaccines 19-59 Average Risk  Aged Out   HPV VACCINES  Aged Out   Meningococcal B Vaccine  Aged Out    Discussed health benefits of physical activity, and encouraged her to engage in regular exercise appropriate for her age and condition.   1. Dental caries (Primary) Referral to dentist given to patient along with instructions to follow up with social worker to change listed secondary insurance   - chlorhexidine  (PERIDEX ) 0.12 % solution; Use as directed 15 mLs in the mouth or throat 2 (two) times daily.  Dispense: 473 mL; Refill: 1  2. Tobacco dependence  - CT CHEST LUNG CA SCREEN LOW DOSE W/O CM; Future  3. Smokes more than 2 packs a day with greater than 20 pack year history  - CT CHEST LUNG CA SCREEN LOW DOSE W/O CM; Future  4. Annual physical exam  - Hgb A1c w/o eAG - CBC with Differential/Platelet - Comprehensive metabolic panel with GFR - Lipid panel - TSH - VITAMIN D  25 Hydroxy (Vit-D Deficiency, Fractures)  5. Need for hepatitis C screening test  - Hepatitis C antibody  6. Colon cancer screening  - Ambulatory referral to Gastroenterology  7. Dental abscess  - amoxicillin  (AMOXIL ) 500 MG capsule; Take 1 capsule (500 mg total) by mouth 3 (three) times daily for 10 days.  Dispense: 30 capsule; Refill: 0  8. Acute bronchitis with COPD (HCC)  -  amoxicillin  (AMOXIL )  500 MG capsule; Take 1 capsule (500 mg total) by mouth 3 (three) times daily for 10 days.  Dispense: 30 capsule; Refill: 0  9. Allergy, initial encounter  - loratadine  (CLARITIN ) 10 MG tablet; Take 1 tablet (10 mg total) by mouth daily.  Dispense: 30 tablet; Refill: 2  10. Chronic bronchitis, unspecified chronic bronchitis type (HCC)  - budesonide -formoterol  (SYMBICORT ) 160-4.5 MCG/ACT inhaler; Inhale 2 puffs into the lungs 2 (two) times daily.  Dispense: 10.2 g; Refill: 3  11. Hypertension, unspecified type  Discussed blood pressure readings today. Patient believes that her blood pressure is only elevated when in the office and that she does check it at home and results are normal. She is asked to record three blood pressure readings to report back so we can monitor for need for medication.   She is agreeable to check labs today and start working on some of her preventative health needs. At this time she is not interested in a Mammogram or Pap  Will follow up with lab results when available    Lauraine Kitty, FNP  **Disclaimer: This note may have been dictated with voice recognition software. Similar sounding words can inadvertently be transcribed and this note may contain transcription errors which may not have been corrected upon publication of note.**

## 2024-04-01 ENCOUNTER — Encounter: Payer: Self-pay | Admitting: Nurse Practitioner

## 2024-04-01 LAB — COMPREHENSIVE METABOLIC PANEL WITH GFR
ALT: 10 IU/L (ref 0–32)
AST: 14 IU/L (ref 0–40)
Albumin: 4.5 g/dL (ref 3.8–4.9)
Alkaline Phosphatase: 94 IU/L (ref 44–121)
BUN/Creatinine Ratio: 11 — ABNORMAL LOW (ref 12–28)
BUN: 8 mg/dL (ref 8–27)
Bilirubin Total: 0.6 mg/dL (ref 0.0–1.2)
CO2: 23 mmol/L (ref 20–29)
Calcium: 9.2 mg/dL (ref 8.7–10.3)
Chloride: 103 mmol/L (ref 96–106)
Creatinine, Ser: 0.74 mg/dL (ref 0.57–1.00)
Globulin, Total: 2.5 g/dL (ref 1.5–4.5)
Glucose: 95 mg/dL (ref 70–99)
Potassium: 4 mmol/L (ref 3.5–5.2)
Sodium: 139 mmol/L (ref 134–144)
Total Protein: 7 g/dL (ref 6.0–8.5)
eGFR: 93 mL/min/1.73 (ref >59)

## 2024-04-01 LAB — CBC WITH DIFFERENTIAL/PLATELET
Basophils Absolute: 0 x10E3/uL (ref 0.0–0.2)
Basos: 0 %
EOS (ABSOLUTE): 0.1 x10E3/uL (ref 0.0–0.4)
Eos: 1 %
Hematocrit: 48 % — ABNORMAL HIGH (ref 34.0–46.6)
Hemoglobin: 15.4 g/dL (ref 11.1–15.9)
Immature Grans (Abs): 0 x10E3/uL (ref 0.0–0.1)
Immature Granulocytes: 0 %
Lymphocytes Absolute: 2.8 x10E3/uL (ref 0.7–3.1)
Lymphs: 51 %
MCH: 31.4 pg (ref 26.6–33.0)
MCHC: 32.1 g/dL (ref 31.5–35.7)
MCV: 98 fL — ABNORMAL HIGH (ref 79–97)
Monocytes Absolute: 0.5 x10E3/uL (ref 0.1–0.9)
Monocytes: 8 %
Neutrophils Absolute: 2.2 x10E3/uL (ref 1.4–7.0)
Neutrophils: 40 %
Platelets: 280 x10E3/uL (ref 150–450)
RBC: 4.9 x10E6/uL (ref 3.77–5.28)
RDW: 12.7 % (ref 11.7–15.4)
WBC: 5.5 x10E3/uL (ref 3.4–10.8)

## 2024-04-01 LAB — LIPID PANEL
Chol/HDL Ratio: 4.1 ratio (ref 0.0–4.4)
Cholesterol, Total: 257 mg/dL — ABNORMAL HIGH (ref 100–199)
HDL: 62 mg/dL (ref >39)
LDL Chol Calc (NIH): 168 mg/dL — ABNORMAL HIGH (ref 0–99)
Triglycerides: 149 mg/dL (ref 0–149)
VLDL Cholesterol Cal: 27 mg/dL (ref 5–40)

## 2024-04-01 LAB — HGB A1C W/O EAG: Hgb A1c MFr Bld: 5.6 % (ref 4.8–5.6)

## 2024-04-01 LAB — VITAMIN D 25 HYDROXY (VIT D DEFICIENCY, FRACTURES): Vit D, 25-Hydroxy: 9.8 ng/mL — ABNORMAL LOW (ref 30.0–100.0)

## 2024-04-01 LAB — TSH: TSH: 1.45 u[IU]/mL (ref 0.450–4.500)

## 2024-04-01 LAB — HEPATITIS C ANTIBODY: Hep C Virus Ab: NONREACTIVE

## 2024-04-01 NOTE — Addendum Note (Signed)
 Addended by: KENNYTH LAURAINE BRAVO on: 04/01/2024 02:01 PM   Modules accepted: Level of Service

## 2024-04-01 NOTE — Addendum Note (Signed)
 Addended by: KENNYTH DOMINO E on: 04/01/2024 02:00 PM   Modules accepted: Level of Service

## 2024-04-05 ENCOUNTER — Encounter: Payer: Self-pay | Admitting: Nurse Practitioner

## 2024-04-06 ENCOUNTER — Ambulatory Visit
Admission: RE | Admit: 2024-04-06 | Discharge: 2024-04-06 | Disposition: A | Discharge status: Home / Self Care | Source: Ambulatory Visit | Attending: Nurse Practitioner | Admitting: Nurse Practitioner

## 2024-04-06 DIAGNOSIS — F17210 Nicotine dependence, cigarettes, uncomplicated: Secondary | ICD-10-CM

## 2024-04-06 DIAGNOSIS — F17200 Nicotine dependence, unspecified, uncomplicated: Secondary | ICD-10-CM

## 2024-04-07 ENCOUNTER — Other Ambulatory Visit (HOSPITAL_COMMUNITY): Payer: Self-pay

## 2024-04-07 NOTE — Addendum Note (Signed)
 Addended by: KENNYTH DOMINO E on: 04/07/2024 11:02 AM   Modules accepted: Level of Service

## 2024-04-18 ENCOUNTER — Ambulatory Visit: Payer: Self-pay | Admitting: Nurse Practitioner

## 2024-05-04 ENCOUNTER — Other Ambulatory Visit: Payer: Self-pay | Admitting: Nurse Practitioner

## 2024-05-04 ENCOUNTER — Other Ambulatory Visit: Payer: Self-pay

## 2024-05-04 DIAGNOSIS — J4541 Moderate persistent asthma with (acute) exacerbation: Secondary | ICD-10-CM

## 2024-05-04 MED ORDER — FLUTICASONE-SALMETEROL 115-21 MCG/ACT IN AERO
2.0000 | INHALATION_SPRAY | Freq: Two times a day (BID) | RESPIRATORY_TRACT | 12 refills | Status: DC
  Filled 2024-05-04 – 2024-06-23 (×5): qty 12, 30d supply, fill #0

## 2024-05-05 ENCOUNTER — Other Ambulatory Visit: Payer: Self-pay

## 2024-05-05 ENCOUNTER — Other Ambulatory Visit (HOSPITAL_COMMUNITY): Payer: Self-pay

## 2024-05-10 ENCOUNTER — Other Ambulatory Visit: Payer: Self-pay

## 2024-05-19 ENCOUNTER — Other Ambulatory Visit (HOSPITAL_COMMUNITY): Payer: Self-pay

## 2024-05-30 ENCOUNTER — Other Ambulatory Visit (HOSPITAL_COMMUNITY): Payer: Self-pay

## 2024-06-03 DIAGNOSIS — R053 Chronic cough: Secondary | ICD-10-CM | POA: Insufficient documentation

## 2024-06-20 ENCOUNTER — Encounter: Payer: Self-pay | Admitting: Internal Medicine

## 2024-06-23 ENCOUNTER — Telehealth: Admitting: Nurse Practitioner

## 2024-06-23 ENCOUNTER — Other Ambulatory Visit (HOSPITAL_COMMUNITY): Payer: Self-pay

## 2024-06-23 ENCOUNTER — Other Ambulatory Visit: Payer: Self-pay

## 2024-06-23 DIAGNOSIS — J439 Emphysema, unspecified: Secondary | ICD-10-CM

## 2024-06-23 DIAGNOSIS — J4541 Moderate persistent asthma with (acute) exacerbation: Secondary | ICD-10-CM | POA: Diagnosis not present

## 2024-06-23 DIAGNOSIS — J42 Unspecified chronic bronchitis: Secondary | ICD-10-CM | POA: Diagnosis not present

## 2024-06-23 DIAGNOSIS — J441 Chronic obstructive pulmonary disease with (acute) exacerbation: Secondary | ICD-10-CM

## 2024-06-23 MED ORDER — FLUTICASONE-SALMETEROL 115-21 MCG/ACT IN AERO
2.0000 | INHALATION_SPRAY | Freq: Two times a day (BID) | RESPIRATORY_TRACT | 12 refills | Status: AC
Start: 2024-06-23 — End: ?
  Filled 2024-06-23: qty 12, 30d supply, fill #0

## 2024-06-23 MED ORDER — DOXYCYCLINE HYCLATE 100 MG PO TABS
100.0000 mg | ORAL_TABLET | Freq: Two times a day (BID) | ORAL | 0 refills | Status: AC
  Filled 2024-06-23: qty 14, 7d supply, fill #0

## 2024-06-23 MED ORDER — ALBUTEROL SULFATE HFA 108 (90 BASE) MCG/ACT IN AERS
2.0000 | INHALATION_SPRAY | Freq: Four times a day (QID) | RESPIRATORY_TRACT | 10 refills | Status: AC | PRN
  Filled 2024-06-23: qty 13.4, 30d supply, fill #0

## 2024-06-23 MED ORDER — PREDNISONE 20 MG PO TABS
20.0000 mg | ORAL_TABLET | Freq: Two times a day (BID) | ORAL | 0 refills | Status: AC
  Filled 2024-06-23: qty 10, 5d supply, fill #0

## 2024-06-23 NOTE — Progress Notes (Signed)
 Acute Video Visit    Virtual Visit Consent:   Nicolle R Hortman, you are scheduled for a virtual visit with a [facility] provider today.     Just as with appointments in the office, your consent must be obtained to participate.  Your consent will be active for this visit and any virtual visit you may have with one of our providers in the next 365 days.     If you have a MyChart account, a copy of this consent can be sent to you electronically.  All virtual visits are billed to your insurance company just like a traditional visit in the office.    If the connection with a video visit is poor, the visit may have to be switched to a telephone visit.  With either a video or telephone visit, we are not always able to ensure that we have a secure connection.     I need to obtain your verbal consent now.   Are you willing to proceed with your visit today?    Laelia R Howden has provided verbal consent on 06/23/2024 for a virtual visit (video or telephone).   Lauraine Kitty, FNP  Date: 06/23/2024 10:12 AM  Subjective:     Patient ID: Kendra Alvarado, female    DOB: October 03, 1965, 58 y.o.   MRN: [id number]  I, Lauraine Kitty, connected with  CHRISTINE MORTON  ([id number], 1965/11/18) on 06/23/24 at 11:00 AM EST by a video-enabled telemedicine application and verified that I am speaking with the correct person using two identifiers.   Location:  Patient: Home  Provider: Virtual Visit Location Provider: Home Office   I discussed the limitations of evaluation and management by telemedicine and the availability of in person appointments. The patient expressed understanding and agreed to proceed.     HPI  SHANAN FITZPATRICK is a 58 y.o. who identifies as a female who was assigned female at birth, and is being seen today for cough and congestion with a headache.  She ran out of her inhaler one week ago. Currently only has her Symbicort  was unable to fill her Albuterol .   She has been taking  dollar store dayquil medicine for support.   On review of recent CT screening there was evidence of Emphysema   She missed her physical this week due to illness    Cough with wheeze and slight SOB without distress      Objective:       Physical Exam Constitutional:      General: She is not in acute distress.    Appearance: She is ill-appearing.  HENT:     Nose: Congestion present.     Mouth/Throat:     Mouth: Mucous membranes are moist.  Pulmonary:     Effort: Pulmonary effort is normal. No respiratory distress.  Musculoskeletal:     Cervical back: Neck supple.  Neurological:     Mental Status: She is alert and oriented to person, place, and time.  Psychiatric:        Mood and Affect: Mood normal.         Assessment & Plan:    Follow up next week in office, earlier with new or worsening symptoms   1. Moderate persistent asthma with acute exacerbation  - fluticasone -salmeterol (ADVAIR  HFA) 115-21 MCG/ACT inhaler; Inhale 2 puffs into the lungs 2 (two) times daily.  Dispense: 12 g; Refill: 12  2. Chronic bronchitis, unspecified chronic bronchitis type (HCC)   3. COPD exacerbation (HCC) (  Primary)  - albuterol  (VENTOLIN  HFA) 108 (90 Base) MCG/ACT inhaler; Inhale 2 puffs into the lungs every 6 (six) hours as needed for wheezing or shortness of breath.  Dispense: 8 g; Refill: 10 - doxycycline (VIBRA-TABS) 100 MG tablet; Take 1 tablet (100 mg total) by mouth 2 (two) times daily for 7 days.  Dispense: 14 tablet; Refill: 0 - predniSONE  (DELTASONE ) 20 MG tablet; Take 1 tablet (20 mg total) by mouth 2 (two) times daily with a meal for 5 days.  Dispense: 10 tablet; Refill: 0  4. Emphysema, unspecified (HCC)  - fluticasone -salmeterol (ADVAIR  HFA) 115-21 MCG/ACT inhaler; Inhale 2 puffs into the lungs 2 (two) times daily.  Dispense: 12 g; Refill: 12 - AMB Referral [facility] Care Management   Follow Up Instructions: I discussed the assessment and treatment plan with the patient.  The patient was provided an opportunity to ask questions and all were answered. The patient agreed with the plan and demonstrated an understanding of the instructions.  A copy of instructions were sent to the patient via MyChart unless otherwise noted below.    The patient was advised to call back or seek an in-person evaluation if the symptoms worsen or if the condition fails to improve as anticipated.    Lauraine Kitty, FNP  **Disclaimer: This note may have been dictated with voice recognition software. Similar sounding words can inadvertently be transcribed and this note may contain transcription errors which may not have been corrected upon publication of note.**

## 2024-06-24 ENCOUNTER — Telehealth: Payer: Self-pay

## 2024-06-24 ENCOUNTER — Other Ambulatory Visit (HOSPITAL_COMMUNITY): Payer: Self-pay

## 2024-06-24 NOTE — Progress Notes (Signed)
 Care Guide Pharmacy Note  06/24/2024 Name: MCKINZIE SAKSA MRN: [id number] DOB: [birth year]  Referred By: Kennyth Domino, FNP Reason for referral: Complex Care Management (Outreach to schedule with Pharm d )   Jenell R Ferrin is a 58 y.o. year old female who is a primary care patient of Kennyth Domino, FNP.  Nani R Douty was referred to the pharmacist for assistance related to: emphysema  An unsuccessful telephone outreach was attempted today to contact the patient who was referred to the pharmacy team for assistance with medication assistance. Additional attempts will be made to contact the patient.  Jeoffrey Buffalo , RMA     [facility]  [facility], [facility] Guide  Direct Dial: [phone]  Website: [email or web address]

## 2024-06-24 NOTE — Progress Notes (Signed)
 Care Guide Pharmacy Note  06/24/2024 Name: Kendra Alvarado MRN: [id number] DOB: 1965-08-30  Referred By: Kennyth Domino, FNP Reason for referral: Complex Care Management (Outreach to schedule with Pharm d )   Kendra Alvarado is a 58 y.o. year old female who is a primary care patient of Kennyth Domino, FNP.  Kendra Alvarado was referred to the pharmacist for assistance related to: emphysema  Successful contact was made with the patient to discuss pharmacy services including being ready for the pharmacist to call at least 5 minutes before the scheduled appointment time and to have medication bottles and any blood pressure readings ready for review. The patient agreed to meet with the pharmacist via telephone visit on (date/time).07/01/2024  Jeoffrey Buffalo , RMA     [facility]  [facility], [facility] Guide  Direct Dial: [phone]  Website: [email or web address]

## 2024-06-30 ENCOUNTER — Ambulatory Visit: Admitting: Nurse Practitioner

## 2024-07-01 ENCOUNTER — Other Ambulatory Visit: Payer: Self-pay

## 2024-07-01 ENCOUNTER — Telehealth: Payer: Self-pay

## 2024-07-01 NOTE — Progress Notes (Signed)
   07/01/2024  Patient ID: Kendra Alvarado, female   DOB: [birth year], 58 y.o.   MRN: [id number]  Attempted to contact patient for scheduled appointment for medication assistance.  Patient answered but continued to speak to someone else while on the phone while I attempted to explain who I was and the reason for the call. Patient confirmed she was still having issues with her insurance at the pharmacy.  Patient kept repeating that she has spoken with them several times and cancelled the plan. Attempted to confirm that the them she was referring to was her [facility] Medicaid plan and that she had confirmed they no longer had another primary insurance plan on file.  Patient became upset, said I was not listening, and ended the call. Will not continue to outreach further.  Jon VEAR Lindau, PharmD Clinical Pharmacist [phone]

## 2024-07-06 ENCOUNTER — Other Ambulatory Visit (HOSPITAL_COMMUNITY): Payer: Self-pay

## 2024-07-14 ENCOUNTER — Ambulatory Visit: Admitting: Nurse Practitioner

## 2024-07-20 ENCOUNTER — Other Ambulatory Visit (HOSPITAL_COMMUNITY): Payer: Self-pay

## 2024-07-21 ENCOUNTER — Other Ambulatory Visit (HOSPITAL_COMMUNITY): Payer: Self-pay

## 2024-08-10 NOTE — Progress Notes (Signed)
 Update made to 5in5

## 2024-08-19 NOTE — Progress Notes (Unsigned)
 The patient presented for a primary care visit on 06/23/2024. Vitals, labs, and SDOH screening was not conducted due to this being a video visit.   A review of the patient's chart revealed that Lauraine Kitty, NP - [facility] is her PCP, her most recent appt was on 06/23/2024, she has a cancelled appt on 06/30/2024 and a no show for 07/14/2024. There are currently no future appointments indicated at this time. The pt has Medicaid and [employer] for her insurance.  07/01/24 call encounter notes indicates pt shared with Jon Lindau, [facility] that she no longer has Medicaid. [facility] did RTE for her insurance and it still indicates Medicaid. Chart review shows she has a history of smoking and a food SDOH need indicated. PCP has addressed smoking with pt and did a [facility] Care Management referral.   Call Attempt #1: [facility] called pt to discuss insurance, food and smoking SDOH status. [facility] did not reach pt and was unable to leave a vm due to the inbox being full.   Call Attempt #2: [facility]W called pt again. CHW is unable to leave vm due to full inbox.   Call Attempt #3: CHW unable to reach pt for third attempt to discuss food, insurance, and smoking SDOH status due to vm being full.  [facility]W sent courtesy letter to pt with food resources, [place] [employer], and smoking cessation resources in case needed by the pt.  An additional follow up will be done according to the health equity team's protocol.
